# Patient Record
Sex: Female | Born: 1950 | Race: Black or African American | Hispanic: No | Smoking: Never smoker
Health system: Southern US, Community
[De-identification: ages and names within clinical notes are randomized; demographics above are authoritative.]

## PROBLEM LIST (undated history)

## (undated) ENCOUNTER — Emergency Department (HOSPITAL_COMMUNITY): Payer: Medicare HMO

## (undated) DIAGNOSIS — K219 Gastro-esophageal reflux disease without esophagitis: Secondary | ICD-10-CM

## (undated) DIAGNOSIS — C801 Malignant (primary) neoplasm, unspecified: Secondary | ICD-10-CM

## (undated) DIAGNOSIS — I10 Essential (primary) hypertension: Secondary | ICD-10-CM

## (undated) DIAGNOSIS — R600 Localized edema: Secondary | ICD-10-CM

## (undated) DIAGNOSIS — G500 Trigeminal neuralgia: Secondary | ICD-10-CM

## (undated) HISTORY — PX: FACIAL NERVE SURGERY: SHX630

## (undated) HISTORY — PX: CHOLECYSTECTOMY: SHX55

## (undated) HISTORY — PX: BREAST LUMPECTOMY: SHX2

## (undated) HISTORY — PX: ABDOMINAL HYSTERECTOMY: SHX81

---

## 1998-03-12 ENCOUNTER — Emergency Department (HOSPITAL_COMMUNITY): Admission: EM | Admit: 1998-03-12 | Discharge: 1998-03-12 | Payer: Self-pay | Admitting: Emergency Medicine

## 1998-04-20 ENCOUNTER — Observation Stay (HOSPITAL_COMMUNITY): Admission: RE | Admit: 1998-04-20 | Discharge: 1998-04-21 | Payer: Self-pay | Admitting: Specialist

## 1998-06-20 ENCOUNTER — Other Ambulatory Visit: Admission: RE | Admit: 1998-06-20 | Discharge: 1998-06-20 | Payer: Self-pay | Admitting: *Deleted

## 1998-10-25 ENCOUNTER — Ambulatory Visit (HOSPITAL_COMMUNITY): Admission: RE | Admit: 1998-10-25 | Discharge: 1998-10-25 | Payer: Self-pay | Admitting: General Surgery

## 1999-04-05 ENCOUNTER — Ambulatory Visit (HOSPITAL_COMMUNITY): Admission: RE | Admit: 1999-04-05 | Discharge: 1999-04-05 | Payer: Self-pay | Admitting: *Deleted

## 1999-10-17 ENCOUNTER — Ambulatory Visit (HOSPITAL_COMMUNITY): Admission: RE | Admit: 1999-10-17 | Discharge: 1999-10-17 | Payer: Self-pay | Admitting: General Surgery

## 1999-10-17 ENCOUNTER — Encounter (HOSPITAL_BASED_OUTPATIENT_CLINIC_OR_DEPARTMENT_OTHER): Payer: Self-pay | Admitting: General Surgery

## 2000-02-01 ENCOUNTER — Emergency Department (HOSPITAL_COMMUNITY): Admission: EM | Admit: 2000-02-01 | Discharge: 2000-02-02 | Payer: Self-pay | Admitting: Emergency Medicine

## 2000-10-12 ENCOUNTER — Ambulatory Visit (HOSPITAL_COMMUNITY): Admission: RE | Admit: 2000-10-12 | Discharge: 2000-10-12 | Payer: Self-pay | Admitting: *Deleted

## 2000-10-12 ENCOUNTER — Encounter: Payer: Self-pay | Admitting: *Deleted

## 2000-10-19 ENCOUNTER — Encounter (HOSPITAL_BASED_OUTPATIENT_CLINIC_OR_DEPARTMENT_OTHER): Payer: Self-pay | Admitting: General Surgery

## 2000-10-19 ENCOUNTER — Ambulatory Visit (HOSPITAL_COMMUNITY): Admission: RE | Admit: 2000-10-19 | Discharge: 2000-10-19 | Payer: Self-pay | Admitting: General Surgery

## 2001-10-29 ENCOUNTER — Ambulatory Visit (HOSPITAL_COMMUNITY): Admission: RE | Admit: 2001-10-29 | Discharge: 2001-10-29 | Payer: Self-pay | Admitting: General Surgery

## 2001-10-29 ENCOUNTER — Encounter (HOSPITAL_BASED_OUTPATIENT_CLINIC_OR_DEPARTMENT_OTHER): Payer: Self-pay | Admitting: General Surgery

## 2001-11-25 ENCOUNTER — Inpatient Hospital Stay (HOSPITAL_COMMUNITY): Admission: EM | Admit: 2001-11-25 | Discharge: 2001-11-27 | Payer: Self-pay | Admitting: Emergency Medicine

## 2001-11-25 ENCOUNTER — Encounter: Payer: Self-pay | Admitting: Endocrinology

## 2001-11-26 ENCOUNTER — Encounter: Payer: Self-pay | Admitting: Cardiology

## 2002-01-07 ENCOUNTER — Emergency Department (HOSPITAL_COMMUNITY): Admission: EM | Admit: 2002-01-07 | Discharge: 2002-01-07 | Payer: Self-pay | Admitting: Emergency Medicine

## 2002-01-07 ENCOUNTER — Encounter: Payer: Self-pay | Admitting: Emergency Medicine

## 2002-05-24 ENCOUNTER — Emergency Department (HOSPITAL_COMMUNITY): Admission: EM | Admit: 2002-05-24 | Discharge: 2002-05-24 | Payer: Self-pay | Admitting: Emergency Medicine

## 2002-05-24 ENCOUNTER — Encounter: Payer: Self-pay | Admitting: Emergency Medicine

## 2002-07-28 ENCOUNTER — Encounter: Payer: Self-pay | Admitting: *Deleted

## 2002-07-28 ENCOUNTER — Ambulatory Visit (HOSPITAL_COMMUNITY): Admission: RE | Admit: 2002-07-28 | Discharge: 2002-07-28 | Payer: Self-pay | Admitting: *Deleted

## 2003-03-22 ENCOUNTER — Encounter: Admission: RE | Admit: 2003-03-22 | Discharge: 2003-03-22 | Payer: Self-pay | Admitting: General Surgery

## 2003-03-22 ENCOUNTER — Encounter (HOSPITAL_BASED_OUTPATIENT_CLINIC_OR_DEPARTMENT_OTHER): Payer: Self-pay | Admitting: General Surgery

## 2003-03-23 ENCOUNTER — Ambulatory Visit (HOSPITAL_COMMUNITY): Admission: RE | Admit: 2003-03-23 | Discharge: 2003-03-23 | Payer: Self-pay | Admitting: Internal Medicine

## 2003-03-23 ENCOUNTER — Encounter (HOSPITAL_BASED_OUTPATIENT_CLINIC_OR_DEPARTMENT_OTHER): Payer: Self-pay | Admitting: General Surgery

## 2005-08-08 ENCOUNTER — Ambulatory Visit: Payer: Self-pay | Admitting: Oncology

## 2006-08-06 ENCOUNTER — Ambulatory Visit: Payer: Self-pay | Admitting: Oncology

## 2006-08-11 ENCOUNTER — Ambulatory Visit (HOSPITAL_COMMUNITY): Admission: RE | Admit: 2006-08-11 | Discharge: 2006-08-11 | Payer: Self-pay | Admitting: Oncology

## 2006-08-11 LAB — CBC WITH DIFFERENTIAL/PLATELET
BASO%: 0.4 % (ref 0.0–2.0)
Eosinophils Absolute: 0.2 10*3/uL (ref 0.0–0.5)
LYMPH%: 21.3 % (ref 14.0–48.0)
MCHC: 33 g/dL (ref 32.0–36.0)
MONO#: 0.4 10*3/uL (ref 0.1–0.9)
NEUT#: 4.3 10*3/uL (ref 1.5–6.5)
Platelets: 301 10*3/uL (ref 145–400)
RBC: 4.45 10*6/uL (ref 3.70–5.32)
RDW: 14.1 % (ref 11.3–14.5)
WBC: 6.3 10*3/uL (ref 3.9–10.0)
lymph#: 1.3 10*3/uL (ref 0.9–3.3)

## 2006-08-11 LAB — COMPREHENSIVE METABOLIC PANEL
ALT: 11 U/L (ref 0–35)
Albumin: 4.1 g/dL (ref 3.5–5.2)
CO2: 25 mEq/L (ref 19–32)
Chloride: 106 mEq/L (ref 96–112)
Glucose, Bld: 93 mg/dL (ref 70–99)
Potassium: 3.9 mEq/L (ref 3.5–5.3)
Sodium: 143 mEq/L (ref 135–145)
Total Protein: 6.8 g/dL (ref 6.0–8.3)

## 2006-08-11 LAB — LACTATE DEHYDROGENASE: LDH: 155 U/L (ref 94–250)

## 2007-08-13 ENCOUNTER — Ambulatory Visit: Payer: Self-pay | Admitting: Oncology

## 2007-08-31 LAB — COMPREHENSIVE METABOLIC PANEL
ALT: 16 U/L (ref 0–35)
AST: 17 U/L (ref 0–37)
Albumin: 4 g/dL (ref 3.5–5.2)
Alkaline Phosphatase: 43 U/L (ref 39–117)
BUN: 6 mg/dL (ref 6–23)
CO2: 26 mEq/L (ref 19–32)
Calcium: 9.1 mg/dL (ref 8.4–10.5)
Chloride: 107 mEq/L (ref 96–112)
Creatinine, Ser: 0.72 mg/dL (ref 0.40–1.20)
Glucose, Bld: 81 mg/dL (ref 70–99)
Potassium: 3.1 mEq/L — ABNORMAL LOW (ref 3.5–5.3)
Sodium: 143 mEq/L (ref 135–145)
Total Bilirubin: 1.1 mg/dL (ref 0.3–1.2)
Total Protein: 6.6 g/dL (ref 6.0–8.3)

## 2007-08-31 LAB — CBC WITH DIFFERENTIAL/PLATELET
BASO%: 0.3 % (ref 0.0–2.0)
Basophils Absolute: 0 10*3/uL (ref 0.0–0.1)
EOS%: 4.2 % (ref 0.0–7.0)
Eosinophils Absolute: 0.2 10*3/uL (ref 0.0–0.5)
HCT: 35.8 % (ref 34.8–46.6)
HGB: 12 g/dL (ref 11.6–15.9)
LYMPH%: 23.2 % (ref 14.0–48.0)
MCH: 28 pg (ref 26.0–34.0)
MCHC: 33.6 g/dL (ref 32.0–36.0)
MCV: 83.4 fL (ref 81.0–101.0)
MONO#: 0.5 10*3/uL (ref 0.1–0.9)
MONO%: 9.6 % (ref 0.0–13.0)
NEUT#: 3.2 10*3/uL (ref 1.5–6.5)
NEUT%: 62.7 % (ref 39.6–76.8)
Platelets: 247 10*3/uL (ref 145–400)
RBC: 4.29 10*6/uL (ref 3.70–5.32)
RDW: 14.9 % — ABNORMAL HIGH (ref 11.3–14.5)
WBC: 5.1 10*3/uL (ref 3.9–10.0)
lymph#: 1.2 10*3/uL (ref 0.9–3.3)

## 2007-08-31 LAB — LACTATE DEHYDROGENASE: LDH: 147 U/L (ref 94–250)

## 2008-08-29 ENCOUNTER — Ambulatory Visit: Payer: Self-pay | Admitting: Oncology

## 2010-12-13 NOTE — H&P (Signed)
[place]  Patient:    Rebekah Steele, Rebekah Steele Visit Number: [id number] MRN: [id number]          Service Type: MED Location: [id number] Attending Physician:  Rollene Rotunda Dictated by:   Rollene Rotunda, M.D. [facility] Admit Date:  11/25/2001   CC:         Justine Null, M.D. [facility]   History and Physical  PRIMARY CARE PHYSICIAN:  Dr. Justine Null.  CARDIOLOGIST:  The patient is new to [place].  HISTORY OF PRESENT ILLNESS:  The patient is a 60 year old African American female with no prior cardiac history.  She does report that she had an exercise treadmill test four to five years ago that was apparently normal. She says that she has been having palpitations where she will feel her heart "pounding."  She is describing isolated premature beats.  She has not been describing any sustained arrhythmias, presyncope or syncope.  She was treated with Toprol on April 25th.  She says that following that she developed some episodes of chest heaviness; she also had some mild dizziness with nausea and some orthostatic symptoms, despite this being a low dose of beta blocker. Last night at 9 p.m. or so, she developed some substernal chest discomfort that radiated through to her back.  This lasted all night long.  She said that at it worst, it was 10/10.  She could not sleep with this.  She became nauseated.  She did have some sweats.  She had some radiation into her right shoulder.  She was referred to the emergency room this morning because of this discomfort.  In the emergency room, she had no significant response to nitroglycerin.  She did get relief after 1 mg of Ativan.  She is currently pain-free.  She says these symptoms are not similar to previous reflux.  The patient does report some shortness of breath with activities such as climbing stairs and a decreased exercise tolerance over the past year.  She otherwise has had no exertional chest discomfort in  the past.  PAST MEDICAL HISTORY:  Gastroesophageal reflux disease; hypertension since 1985; diabetes mellitus, recently diagnosed; anxiety/depression; eczema; breast cancer, status post lumpectomy; hyperlipidemia.  PAST SURGICAL HISTORY:  Lumpectomy and lymph node dissection in 1992; cholecystectomy/appendectomy in 1983; hysterectomy in 1993.  ALLERGIES:  None.  MEDICATIONS:  1. K-Dur 20 mEq b.i.d.  2. Glucophage 1 g b.i.d.  3. Nexium 40 mg q.d.  4. Atarax 25 mg q.d.  5. Wellbutrin 150 mg b.i.d.  6. Lasix 20 mg q.d.  7. Actos 30 mg q.d.  8. Mevacor 20 mg q.d.  9. Toprol-XL 25 mg q.d. 10. Avandia 4 mg q.d. 11. Maxzide 25 mg q.d.  SOCIAL HISTORY:  The patient lives in [place] with her husband.  She is applying for disability.  She does not smoke or drink alcohol.  FAMILY HISTORY:  Family history is noncontributory for early coronary artery disease.  There is a history of diabetes in the family.  REVIEW OF SYSTEMS:  Positive for headaches, chronic blurred vision. Otherwise, as stated in HPI.  Negative for all other systems.  PHYSICAL EXAMINATION:  GENERAL:  The patient is in no distress.  VITAL SIGNS:  Blood pressure 135/62, heart rate 60 and regular, afebrile.  HEENT:  Eyes unremarkable; pupils equal, round and reactive to light; fundi not visualized.  Oral mucosa unremarkable.  NECK:  No jugular venous distention, wave form within normal limits, carotid upstroke brisk and symmetric.  No bruits, no thyromegaly.  LYMPHATICS:  No cervical, axillary or inguinal adenopathy.  LUNGS:  Clear to auscultation bilaterally.  BACK:  No costovertebral angle tenderness.  CHEST:  Unremarkable.  HEART:  PMI not displaced or sustained.  S1 and S2 within normal limits.  No S3, no S4, no murmurs.  ABDOMEN:  Obese.  Positive bowel sounds, normal in frequency and pitch.  No bruits, rebound, guarding or pulsatile masses.  No hepatomegaly, splenomegaly.  SKIN:  No rash, no  nodules.  EXTREMITIES:  Pulses 2+ throughout, no edema.  NEUROLOGIC:  Oriented to person, place and time.  Cranial nerves II-XII grossly intact.  Motor grossly intact throughout.  LABORATORY AND ACCESSORY DATA:  EKG:  Sinus rhythm, rate 59, axis within normal limits, intervals within normal limits, T wave inversions in V3 through V5, slight and unchanged from a January 2003 EKG, no acute ST segment elevation.  Chest x-ray:  No acute disease.  Labs:  Hemoglobin 13.3, WBC 6.5, platelets 238,000; sodium 143, potassium 4, BUN 12, creatinine 0.8; CK 148, MB 0.7, troponin less than 0.01.  ASSESSMENT AND PLAN:  1. Chest pain:  The patients chest pain is predominantly atypical in her     description.  She had pain all night long and despite this, had no     elevation in her enzymes.  The discomfort did not go away with     nitroglycerin.  She has had no objective evidence of ischemia (normal     enzymes and no acute ST segment changes), however, she does have     cardiovascular risk factors.  Given this, I think the most prudent test is     a stress perfusion study.  I have discussed this at length with the     patient and she understands the risks and benefits and agrees to proceed.     We will continue to rule out an infarct with serial enzymes.  2. Metabolic syndrome:  The patient has classic features of metabolic     syndrome and is being followed and treated aggressively by Dr. Everardo All.     We will continue the medications as previously listed for her     hyperlipidemia and hyperglycemia. Dictated by:   Rollene Rotunda, M.D. [facility] Attending Physician:  Rollene Rotunda DD:  11/25/01 TD:  11/26/01 Job: [id number]

## 2010-12-13 NOTE — Discharge Summary (Signed)
[place]  Patient:    Rebekah Steele, Rebekah Steele Visit Number: [id number] MRN: [id number]          Service Type: MED Location: [id number] Attending Physician:  Rollene Rotunda Dictated by:   Brita Romp, P.A. Admit Date:  11/25/2001 Discharge Date: 11/27/2001   CC:         Justine Null, M.D. [facility]   Discharge Summary  DISCHARGE DIAGNOSES:  1. Chest pain.  2. Palpitations.  3. Gastroesophageal reflux disease.  4. Hypertension.  5. Diabetes mellitus.  6. Anxiety/depression.  7. Eczema.  8. History of breast cancer, status post lumpectomy.  9. Hyperlipidemia.  HOSPITAL COURSE:  Rebekah Steele is a 60 year old female with no prior coronary artery disease history. On the night prior to admission, she developed some substernal chest pain which radiated through to her back and lasted all night long. She presented to the emergency room the following day for treatment. She had no response to nitroglycerin; however, did get relief after approximately 1 mg of Ativan. She was then admitted by Dr. Rollene Rotunda. Dr. Antoine Poche felt the patients pain was rather atypical; however, given her cardiovascular risk factors, he planned to admit her and rule out for myocardial infarction. If enzymes were negative then the patient would undergo a nuclear stress test. He also felt that the patient had classic features of metabolic syndrome which is being treated and followed aggressively by Dr. Everardo All.  The next day, the patient underwent adenosine cardiolite stress test. Nuclear imaging revealed an ejection fraction of 57% with no ischemia or infarction.  The following day, the patient was doing well and was felt to be stable for discharge.  DISCHARGE MEDICATIONS:  1. Potassium 20 mEq b.i.d.  2. Glucophage 1000 mg b.i.d.  3. Nexium 40 mg q.d.  4. Atarax 25 mg q.d.  5. Wellbutrin 150 mg b.i.d.  6. Lasix 20 mg q.d.  7. Actose 30 mg q.d.  8. Mevacor 20 mg  q.h.s.  9. Toprol XL 25 mg q.d. 10. Avandia 4 mg q.d. 11. Maxzide 25 mg q.d. 12. Enteric coated aspirin 325 mg q.d.  LABORATORY DATA:  White count 6.5, hemoglobin 13.3, hematocrit 40.5, platelets 238. Sodium 143, potassium 4.0, chloride 107, CO2 28, BUN 12, creatinine 0.8, glucose 80. Total protein 7.7, albumin 4.0. AST 27, ALT 17, alkaline phosphatase 38, total bilirubin 1.4. Glycosylated hemoglobin A1C was pending at the time discharge.  Electrocardiogram shows sinus bradycardia 59. PRF of 146, QRS 79, QTC 413, ______ 51.  DISCHARGE INSTRUCTIONS:  1. The patient is to gradually increase level of activity.  2. She is to follow a low fat diabetic diet.  3. She is to contact Dr. Everardo All for a follow-up appointment. Dictated by:   Brita Romp, P.A. Attending Physician:  Rollene Rotunda DD:  11/27/01 TD:  11/30/01 Job: [id number]

## 2011-11-10 ENCOUNTER — Other Ambulatory Visit: Payer: Self-pay | Admitting: Cardiovascular Disease

## 2011-11-10 ENCOUNTER — Ambulatory Visit
Admission: RE | Admit: 2011-11-10 | Discharge: 2011-11-10 | Disposition: A | Discharge status: Home / Self Care | Payer: Medicaid Other | Source: Ambulatory Visit | Attending: Cardiovascular Disease | Admitting: Cardiovascular Disease

## 2011-11-10 DIAGNOSIS — R200 Anesthesia of skin: Secondary | ICD-10-CM

## 2012-02-17 ENCOUNTER — Ambulatory Visit
Admission: RE | Admit: 2012-02-17 | Discharge: 2012-02-17 | Disposition: A | Discharge status: Home / Self Care | Payer: Medicaid Other | Source: Ambulatory Visit | Attending: Cardiovascular Disease | Admitting: Cardiovascular Disease

## 2012-02-17 ENCOUNTER — Other Ambulatory Visit: Payer: Self-pay | Admitting: Cardiovascular Disease

## 2012-02-17 DIAGNOSIS — M7500 Adhesive capsulitis of unspecified shoulder: Secondary | ICD-10-CM

## 2012-04-22 ENCOUNTER — Other Ambulatory Visit: Payer: Self-pay | Admitting: Sports Medicine

## 2012-04-22 DIAGNOSIS — M25511 Pain in right shoulder: Secondary | ICD-10-CM

## 2012-04-25 ENCOUNTER — Other Ambulatory Visit: Payer: Medicaid Other

## 2012-04-26 ENCOUNTER — Ambulatory Visit
Admission: RE | Admit: 2012-04-26 | Discharge: 2012-04-26 | Disposition: A | Discharge status: Home / Self Care | Payer: Medicaid Other | Source: Ambulatory Visit | Attending: Sports Medicine | Admitting: Sports Medicine

## 2012-04-26 DIAGNOSIS — M25511 Pain in right shoulder: Secondary | ICD-10-CM

## 2012-08-30 ENCOUNTER — Ambulatory Visit (HOSPITAL_BASED_OUTPATIENT_CLINIC_OR_DEPARTMENT_OTHER): Admission: RE | Admit: 2012-08-30 | Payer: Medicare Other | Source: Ambulatory Visit | Admitting: Specialist

## 2012-08-30 ENCOUNTER — Encounter (HOSPITAL_BASED_OUTPATIENT_CLINIC_OR_DEPARTMENT_OTHER): Admission: RE | Payer: Self-pay | Source: Ambulatory Visit

## 2012-08-30 SURGERY — SEPTOPLASTY, NOSE, WITH NASAL TURBINATE REDUCTION
Anesthesia: General | Laterality: Bilateral

## 2012-09-20 ENCOUNTER — Other Ambulatory Visit: Payer: Self-pay | Admitting: Cardiovascular Disease

## 2012-09-20 ENCOUNTER — Ambulatory Visit
Admission: RE | Admit: 2012-09-20 | Discharge: 2012-09-20 | Disposition: A | Discharge status: Home / Self Care | Payer: Medicare Other | Source: Ambulatory Visit | Attending: Cardiovascular Disease | Admitting: Cardiovascular Disease

## 2012-09-20 DIAGNOSIS — A150 Tuberculosis of lung: Secondary | ICD-10-CM

## 2013-05-16 ENCOUNTER — Other Ambulatory Visit: Payer: Self-pay | Admitting: Cardiovascular Disease

## 2013-05-16 ENCOUNTER — Ambulatory Visit
Admission: RE | Admit: 2013-05-16 | Discharge: 2013-05-16 | Disposition: A | Discharge status: Home / Self Care | Payer: Medicare Other | Source: Ambulatory Visit | Attending: Cardiovascular Disease | Admitting: Cardiovascular Disease

## 2013-05-16 DIAGNOSIS — M549 Dorsalgia, unspecified: Secondary | ICD-10-CM

## 2015-11-26 DIAGNOSIS — R600 Localized edema: Secondary | ICD-10-CM

## 2015-11-26 HISTORY — DX: Localized edema: R60.0

## 2015-12-26 ENCOUNTER — Observation Stay (HOSPITAL_COMMUNITY): Payer: Medicare Other

## 2015-12-26 ENCOUNTER — Inpatient Hospital Stay (HOSPITAL_COMMUNITY)
Admission: AD | Admit: 2015-12-26 | Discharge: 2015-12-31 | DRG: 948 | Disposition: A | Discharge status: Home / Self Care | Payer: Medicare Other | Source: Ambulatory Visit | Attending: Cardiovascular Disease | Admitting: Cardiovascular Disease

## 2015-12-26 ENCOUNTER — Encounter (HOSPITAL_COMMUNITY): Payer: Self-pay | Admitting: General Practice

## 2015-12-26 DIAGNOSIS — R600 Localized edema: Principal | ICD-10-CM | POA: Diagnosis present

## 2015-12-26 DIAGNOSIS — Z91040 Latex allergy status: Secondary | ICD-10-CM

## 2015-12-26 DIAGNOSIS — Z79899 Other long term (current) drug therapy: Secondary | ICD-10-CM

## 2015-12-26 DIAGNOSIS — R079 Chest pain, unspecified: Secondary | ICD-10-CM

## 2015-12-26 DIAGNOSIS — D72819 Decreased white blood cell count, unspecified: Secondary | ICD-10-CM | POA: Diagnosis present

## 2015-12-26 DIAGNOSIS — Z8507 Personal history of malignant neoplasm of pancreas: Secondary | ICD-10-CM | POA: Diagnosis present

## 2015-12-26 DIAGNOSIS — Z888 Allergy status to other drugs, medicaments and biological substances status: Secondary | ICD-10-CM

## 2015-12-26 DIAGNOSIS — A419 Sepsis, unspecified organism: Secondary | ICD-10-CM

## 2015-12-26 DIAGNOSIS — Z9884 Bariatric surgery status: Secondary | ICD-10-CM

## 2015-12-26 DIAGNOSIS — Z91048 Other nonmedicinal substance allergy status: Secondary | ICD-10-CM

## 2015-12-26 DIAGNOSIS — I255 Ischemic cardiomyopathy: Secondary | ICD-10-CM | POA: Diagnosis present

## 2015-12-26 DIAGNOSIS — E440 Moderate protein-calorie malnutrition: Secondary | ICD-10-CM | POA: Insufficient documentation

## 2015-12-26 DIAGNOSIS — T8579XA Infection and inflammatory reaction due to other internal prosthetic devices, implants and grafts, initial encounter: Secondary | ICD-10-CM

## 2015-12-26 DIAGNOSIS — I10 Essential (primary) hypertension: Secondary | ICD-10-CM | POA: Diagnosis present

## 2015-12-26 DIAGNOSIS — R634 Abnormal weight loss: Secondary | ICD-10-CM | POA: Diagnosis present

## 2015-12-26 DIAGNOSIS — Z90710 Acquired absence of both cervix and uterus: Secondary | ICD-10-CM

## 2015-12-26 DIAGNOSIS — E876 Hypokalemia: Secondary | ICD-10-CM | POA: Diagnosis present

## 2015-12-26 DIAGNOSIS — Z7982 Long term (current) use of aspirin: Secondary | ICD-10-CM

## 2015-12-26 DIAGNOSIS — I509 Heart failure, unspecified: Secondary | ICD-10-CM

## 2015-12-26 HISTORY — DX: Malignant (primary) neoplasm, unspecified: C80.1

## 2015-12-26 HISTORY — DX: Essential (primary) hypertension: I10

## 2015-12-26 HISTORY — DX: Trigeminal neuralgia: G50.0

## 2015-12-26 HISTORY — DX: Gastro-esophageal reflux disease without esophagitis: K21.9

## 2015-12-26 HISTORY — DX: Localized edema: R60.0

## 2015-12-26 LAB — COMPREHENSIVE METABOLIC PANEL
ALBUMIN: 2.7 g/dL — AB (ref 3.5–5.0)
ALK PHOS: 40 U/L (ref 38–126)
ALT: 26 U/L (ref 14–54)
ANION GAP: 5 (ref 5–15)
AST: 28 U/L (ref 15–41)
BILIRUBIN TOTAL: 0.6 mg/dL (ref 0.3–1.2)
BUN: 12 mg/dL (ref 6–20)
CALCIUM: 8.7 mg/dL — AB (ref 8.9–10.3)
CO2: 30 mmol/L (ref 22–32)
Chloride: 103 mmol/L (ref 101–111)
Creatinine, Ser: 0.77 mg/dL (ref 0.44–1.00)
GFR calc non Af Amer: >60 mL/min (ref >60)
GLUCOSE: 85 mg/dL (ref 65–99)
Potassium: 3.1 mmol/L — ABNORMAL LOW (ref 3.5–5.1)
Sodium: 138 mmol/L (ref 135–145)
TOTAL PROTEIN: 5.1 g/dL — AB (ref 6.5–8.1)

## 2015-12-26 LAB — CBC WITH DIFFERENTIAL/PLATELET
BASOS ABS: 0 10*3/uL (ref 0.0–0.1)
BASOS PCT: 1 %
Eosinophils Absolute: 0.3 10*3/uL (ref 0.0–0.7)
Eosinophils Relative: 10 %
HEMATOCRIT: 37.1 % (ref 36.0–46.0)
HEMOGLOBIN: 12 g/dL (ref 12.0–15.0)
LYMPHS PCT: 14 %
Lymphs Abs: 0.4 10*3/uL — ABNORMAL LOW (ref 0.7–4.0)
MCH: 29.4 pg (ref 26.0–34.0)
MCHC: 32.3 g/dL (ref 30.0–36.0)
MCV: 90.9 fL (ref 78.0–100.0)
Monocytes Absolute: 0.4 10*3/uL (ref 0.1–1.0)
Monocytes Relative: 11 %
NEUTROS ABS: 2 10*3/uL (ref 1.7–7.7)
NEUTROS PCT: 64 %
Platelets: 151 10*3/uL (ref 150–400)
RBC: 4.08 MIL/uL (ref 3.87–5.11)
RDW: 14.8 % (ref 11.5–15.5)
WBC: 3.2 10*3/uL — AB (ref 4.0–10.5)

## 2015-12-26 LAB — TSH: TSH: 0.993 u[IU]/mL (ref 0.350–4.500)

## 2015-12-26 LAB — ECHOCARDIOGRAM COMPLETE
Height: 67 in
Weight: 2158.74 oz

## 2015-12-26 LAB — TROPONIN I: Troponin I: 0.03 ng/mL (ref <0.031)

## 2015-12-26 LAB — BRAIN NATRIURETIC PEPTIDE: B Natriuretic Peptide: 60.2 pg/mL (ref 0.0–100.0)

## 2015-12-26 MED ORDER — ENSURE ENLIVE PO LIQD
237.0000 mL | Freq: Two times a day (BID) | ORAL | Status: DC
  Administered 2015-12-26 – 2015-12-27 (×2): 237 mL via ORAL

## 2015-12-26 MED ORDER — ONDANSETRON HCL 4 MG/2ML IJ SOLN: 4.0000 mg | Freq: Four times a day (QID) | INTRAMUSCULAR | Status: DC | PRN

## 2015-12-26 MED ORDER — FUROSEMIDE 10 MG/ML IJ SOLN
40.0000 mg | Freq: Once | INTRAMUSCULAR | Status: DC
  Filled 2015-12-26 (×2): qty 4

## 2015-12-26 MED ORDER — SODIUM CHLORIDE 0.9 % IV SOLN: 250.0000 mL | INTRAVENOUS | Status: DC | PRN

## 2015-12-26 MED ORDER — SODIUM CHLORIDE 0.9% FLUSH
10.0000 mL | INTRAVENOUS | Status: DC | PRN
  Administered 2015-12-26 – 2015-12-31 (×3): 10 mL
  Filled 2015-12-26 (×3): qty 40

## 2015-12-26 MED ORDER — ACETAMINOPHEN 325 MG PO TABS
650.0000 mg | ORAL_TABLET | ORAL | Status: DC | PRN
  Administered 2015-12-26 – 2015-12-29 (×4): 650 mg via ORAL
  Filled 2015-12-26 (×4): qty 2

## 2015-12-26 MED ORDER — LIDOCAINE 5 % EX OINT
TOPICAL_OINTMENT | Freq: Three times a day (TID) | CUTANEOUS | Status: DC | PRN
  Filled 2015-12-26: qty 35.44

## 2015-12-26 MED ORDER — SODIUM CHLORIDE 0.9% FLUSH
3.0000 mL | Freq: Two times a day (BID) | INTRAVENOUS | Status: DC
Start: 2015-12-26 — End: 2015-12-31
  Administered 2015-12-26 – 2015-12-30 (×4): 3 mL via INTRAVENOUS

## 2015-12-26 MED ORDER — FUROSEMIDE 10 MG/ML IJ SOLN
40.0000 mg | Freq: Once | INTRAMUSCULAR | Status: DC
  Administered 2015-12-26: 40 mg via INTRAVENOUS

## 2015-12-26 MED ORDER — SODIUM CHLORIDE 0.9% FLUSH: 3.0000 mL | INTRAVENOUS | Status: DC | PRN

## 2015-12-26 MED ORDER — LIDOCAINE-PRILOCAINE 2.5-2.5 % EX CREA
TOPICAL_CREAM | Freq: Once | CUTANEOUS | Status: AC
  Administered 2015-12-26: 1 via TOPICAL
  Filled 2015-12-26: qty 5

## 2015-12-26 MED ORDER — HEPARIN SODIUM (PORCINE) 5000 UNIT/ML IJ SOLN
5000.0000 [IU] | Freq: Three times a day (TID) | INTRAMUSCULAR | Status: DC
  Administered 2015-12-26 – 2015-12-28 (×6): 5000 [IU] via SUBCUTANEOUS
  Filled 2015-12-26 (×6): qty 1

## 2015-12-26 NOTE — H&P (Signed)
Referring Physician:   [name] is an 65 y.o. female.                       Chief Complaint: Bilateral leg edema  HPI: 65 year old female with PMH of Hypertension, s/p hysterectomy, Breast cancer surgery, GB surgery-1986, Gastric bypass 2008 and Pancreatic surgery for cancer at [facility] 03/2015 has abnormal weight loss, shortness of breath and bilateral leg edema without chest pain.   Past medical history: No diabetes, + hypertension since 1985, No smoking, No alcohol, No MI, No obesity, No exercise and no FH of premature .coronary artery disease.  Past surgical history: Partial hysterectomy-1996, GB surgery-1986, Breast cancer surgery-1991. Gastric bypass-02/2007, Trigeminal neuralgia-2010. Failed right nasal septal surgery and Pancreatic cancer surgery -04/08/2015, followed by chemotherapy and radiation therapy..  Family history : Mom died of renal failure age 2.Dad died of brain aneurysm rupture at age 52. Two brothers, living and well. 6 Sisters, all living and some have DM, II and Hypertension. on file.  Social History:  has no tobacco, alcohol, and drug history. Separated x 4 years. She has 2 daughters-34 and 38 yrs old and one son 14 years old. She is a retired and disabled bus Corporate treasurer.  Allergies: Carvedilol-bradycardia, Latex, Adhesive tape and paper tape. Lisinopril-Lip swelling  Prescriptions prior to admission  Aspirin 81 mg. one daily. Amlodipine 2.5 mg. In PM. HCTZ 12.5 mg. In AM. Potassium 10 meq. one daily. Calcium 600 mg. one daily. Vitamin D3 2000 units daily. Singulair 10 mg. one daily as needed. Flonase one squirt each nostril as needed. Magnesium oxide 400 mg. one daily. Iron 65 mg. one daily. Omeprazole 40 mg. One daily.   No results found for this or any previous visit (from the past 48 hour(s)). No results found.  Review Of Systems Positive 20 pound weight loss in 2-3 years, Wears reading and driving glasses, No cataract surgery,  Positive hearing loss in right ear.with tinnitus, + rhinorrhea, No dentures.  Positive Asthma, no pneumonia, No COPD.  No chest pain, No palpitations, + leg edema. Positive nausea, no vomiting, no diarrhea or constipation, No GI bleed. No GU bleed, No kidney stone. No stroke, seizures or psychiatric admission. Positive Joint pains.  Blood pressure 127/78, pulse 61, temperature 98.1 F (36.7 C), temperature source Oral, resp. rate 20, height 5\' 7"  (1.702 m), weight 61.2 kg (134 lb 14.7 oz). HEENT: [place]/AT, Brown eyes, Wears glasses, Conj-pink, Sclera-white. Neck: No JVD, No bruit. No thyromegaly. Lungs: Clear, bil. Heart: S1 and S2 normal. II/VI systolic and diastolic murmur. Abdomen: Soft, non-tender. Ext- 2 + edema. No cyanosis or clubbing. CNS: Cranial nerves grossly intact. Bil. Equal grips. Right handed. Skin: Warm and dry.  Assessment/Plan Bil. Leg edema R/O CHF S/P pancreatic surgery Severe protein calorie malnutrition Hypertension  Place in observation/Lasix/Home medications/Echocardiogram.  Birdie Riddle, MD  12/26/2015, 2:53 PM

## 2015-12-26 NOTE — Progress Notes (Signed)
  Echocardiogram 2D Echocardiogram has been performed.  Rebekah Steele 12/26/2015, 2:55 PM

## 2015-12-27 ENCOUNTER — Observation Stay (HOSPITAL_COMMUNITY): Payer: Medicare Other

## 2015-12-27 DIAGNOSIS — E440 Moderate protein-calorie malnutrition: Secondary | ICD-10-CM | POA: Insufficient documentation

## 2015-12-27 LAB — BASIC METABOLIC PANEL
Anion gap: 6 (ref 5–15)
BUN: 15 mg/dL (ref 6–20)
CHLORIDE: 101 mmol/L (ref 101–111)
CO2: 32 mmol/L (ref 22–32)
CREATININE: 0.74 mg/dL (ref 0.44–1.00)
Calcium: 8.6 mg/dL — ABNORMAL LOW (ref 8.9–10.3)
GFR calc Af Amer: >60 mL/min (ref >60)
GFR calc non Af Amer: >60 mL/min (ref >60)
GLUCOSE: 77 mg/dL (ref 65–99)
Potassium: 2.8 mmol/L — ABNORMAL LOW (ref 3.5–5.1)
SODIUM: 139 mmol/L (ref 135–145)

## 2015-12-27 LAB — TROPONIN I
Troponin I: <0.03 ng/mL (ref <0.031)
Troponin I: <0.03 ng/mL (ref <0.031)

## 2015-12-27 MED ORDER — ALBUMIN HUMAN 25 % IV SOLN
12.5000 g | Freq: Once | INTRAVENOUS | Status: AC
  Administered 2015-12-28: 12.5 g via INTRAVENOUS
  Filled 2015-12-27 (×2): qty 50

## 2015-12-27 MED ORDER — AMLODIPINE BESYLATE 2.5 MG PO TABS: 2.5000 mg | ORAL_TABLET | Freq: Every day | ORAL | Status: DC

## 2015-12-27 MED ORDER — PANTOPRAZOLE SODIUM 40 MG PO TBEC
40.0000 mg | DELAYED_RELEASE_TABLET | Freq: Every day | ORAL | Status: DC
  Administered 2015-12-27 – 2015-12-30 (×4): 40 mg via ORAL
  Filled 2015-12-27 (×4): qty 1

## 2015-12-27 MED ORDER — DOCUSATE SODIUM 100 MG PO CAPS
100.0000 mg | ORAL_CAPSULE | Freq: Every day | ORAL | Status: DC
  Administered 2015-12-27 – 2015-12-30 (×4): 100 mg via ORAL
  Filled 2015-12-27 (×4): qty 1

## 2015-12-27 MED ORDER — FUROSEMIDE 40 MG PO TABS
40.0000 mg | ORAL_TABLET | Freq: Once | ORAL | Status: AC
  Administered 2015-12-27: 40 mg via ORAL
  Filled 2015-12-27: qty 1

## 2015-12-27 MED ORDER — DARIFENACIN HYDROBROMIDE ER 7.5 MG PO TB24
7.5000 mg | ORAL_TABLET | Freq: Every day | ORAL | Status: DC
  Administered 2015-12-27 – 2015-12-30 (×4): 7.5 mg via ORAL
  Filled 2015-12-27 (×5): qty 1

## 2015-12-27 MED ORDER — MAGNESIUM OXIDE 400 (241.3 MG) MG PO TABS
400.0000 mg | ORAL_TABLET | Freq: Every day | ORAL | Status: DC
  Administered 2015-12-27 – 2015-12-30 (×4): 400 mg via ORAL
  Filled 2015-12-27 (×4): qty 1

## 2015-12-27 MED ORDER — ADULT MULTIVITAMIN W/MINERALS CH
1.0000 | ORAL_TABLET | Freq: Every day | ORAL | Status: DC
  Administered 2015-12-27 – 2015-12-30 (×4): 1 via ORAL
  Filled 2015-12-27 (×4): qty 1

## 2015-12-27 MED ORDER — ASPIRIN EC 81 MG PO TBEC
81.0000 mg | DELAYED_RELEASE_TABLET | Freq: Every day | ORAL | Status: DC
  Administered 2015-12-27 – 2015-12-31 (×5): 81 mg via ORAL
  Filled 2015-12-27 (×5): qty 1

## 2015-12-27 MED ORDER — POTASSIUM CHLORIDE ER 10 MEQ PO TBCR
20.0000 meq | EXTENDED_RELEASE_TABLET | Freq: Four times a day (QID) | ORAL | Status: DC
  Administered 2015-12-27 (×2): 20 meq via ORAL
  Filled 2015-12-27 (×5): qty 2

## 2015-12-27 MED ORDER — CYCLOSPORINE 0.05 % OP EMUL
1.0000 [drp] | Freq: Every day | OPHTHALMIC | Status: DC
  Administered 2015-12-27 – 2015-12-30 (×4): 1 [drp] via OPHTHALMIC
  Filled 2015-12-27 (×5): qty 1

## 2015-12-27 MED ORDER — PANCRELIPASE (LIP-PROT-AMYL) 12000-38000 UNITS PO CPEP
12000.0000 [IU] | ORAL_CAPSULE | Freq: Three times a day (TID) | ORAL | Status: DC
  Administered 2015-12-27 – 2015-12-31 (×7): 12000 [IU] via ORAL
  Filled 2015-12-27 (×10): qty 1

## 2015-12-27 MED ORDER — POTASSIUM CHLORIDE ER 10 MEQ PO TBCR
10.0000 meq | EXTENDED_RELEASE_TABLET | Freq: Four times a day (QID) | ORAL | Status: DC
  Administered 2015-12-27 (×2): 10 meq via ORAL
  Filled 2015-12-27 (×6): qty 1

## 2015-12-27 MED ORDER — FERROUS SULFATE 325 (65 FE) MG PO TABS
325.0000 mg | ORAL_TABLET | Freq: Every day | ORAL | Status: DC
  Administered 2015-12-27 – 2015-12-30 (×3): 325 mg via ORAL
  Filled 2015-12-27 (×4): qty 1

## 2015-12-27 MED ORDER — HYDRALAZINE HCL 10 MG PO TABS
10.0000 mg | ORAL_TABLET | Freq: Three times a day (TID) | ORAL | Status: DC
  Administered 2015-12-27 – 2015-12-28 (×2): 10 mg via ORAL
  Filled 2015-12-27 (×2): qty 1

## 2015-12-27 MED ORDER — MONTELUKAST SODIUM 10 MG PO TABS
10.0000 mg | ORAL_TABLET | Freq: Every day | ORAL | Status: DC
  Administered 2015-12-27 – 2015-12-30 (×4): 10 mg via ORAL
  Filled 2015-12-27 (×4): qty 1

## 2015-12-27 NOTE — Progress Notes (Signed)
Initial Nutrition Assessment  DOCUMENTATION CODES:   Non-severe (moderate) malnutrition in context of chronic illness  INTERVENTION:  Provide [employer] Breakfast daily Provide snacks BID Provided and discussed "Suggestions for Increasing Calories and Protein" handout from the Academy of Nutrition and Dietetics Provide Multivitamin with minerals daily   NUTRITION DIAGNOSIS:   Inadequate oral intake related to poor appetite, nausea, other (see comment) (taste changes) as evidenced by per patient/family report, moderate depletions of muscle mass, moderate depletion of body fat.   GOAL:   Patient will meet greater than or equal to 90% of their needs   MONITOR:   PO intake, Labs, Weight trends, Skin, I & O's  REASON FOR ASSESSMENT:   Malnutrition Screening Tool    ASSESSMENT:   65 year old female with PMH of Hypertension, s/p hysterectomy, Breast cancer surgery, GB surgery-1986, Gastric bypass 2008 and Pancreatic surgery for cancer at [facility] 03/2015 has abnormal weight loss, shortness of breath and bilateral leg edema without chest pain.   Pt states that after her pancreatic surgery in September in 2016, she had issues with reflux and poor appetite, was started on Prilosec with some improvement, but over the past 3 months she has continued to have a poor appetite, taste changes and some nausea. She reports that for the past 3 months she has been eating one plate of food throughout the day. She reports losing from 148 lbs to 124 lbs during this time. She dislikes Ensure. RD emphasized the importance of adequate nutrition and energy intake and weight maintenance. Discussed ways to increase calories and protein daily. Pt recently found a breakfast protein drink at home that she will continue after discharge. She reports getting hungry in the middle of the night- RD will order snacks.  Labs: low potassium, low calcium  Diet Order:  Diet Heart Room service appropriate?: Yes; Fluid  consistency:: Thin; Fluid restriction:: 1200 mL Fluid Diet NPO time specified  Skin:  Reviewed, no issues  Last BM:  5/31  Height:   Ht Readings from Last 1 Encounters:  12/26/15 5\' 7"  (1.702 m)    Weight:   Wt Readings from Last 1 Encounters:  12/27/15 128 lb 1.6 oz (58.106 kg)    Ideal Body Weight:  61.36 kg  BMI:  Body mass index is 20.06 kg/(m^2).  Estimated Nutritional Needs:   Kcal:  1700-1900  Protein:  70-80 grams  Fluid:  >/=1.7 L/day  EDUCATION NEEDS:   Education needs addressed  Scarlette Ar RD, LDN Inpatient Clinical Dietitian Pager: [phone] After Hours Pager: [phone]

## 2015-12-27 NOTE — Progress Notes (Signed)
Ref: Birdie Riddle, MD   Subjective:  Good diuresis with lasix. Breathing better. Echocardiogram with preserved LV systolic function. Very low albumin level.  Objective:  Vital Signs in the last 24 hours: Temp:  [97.9 F (36.6 C)-98.5 F (36.9 C)] 98.5 F (36.9 C) (06/01 1701) Pulse Rate:  [53-98] 60 (06/01 1701) Cardiac Rhythm:  [-] Normal sinus rhythm (06/01 0743) Resp:  [16-18] 18 (06/01 1701) BP: (105-128)/(61-76) 128/74 mmHg (06/01 1701) SpO2:  [100 %] 100 % (06/01 1701) Weight:  [58.106 kg (128 lb 1.6 oz)] 58.106 kg (128 lb 1.6 oz) (06/01 0705)  Physical Exam: BP Readings from Last 1 Encounters:  12/27/15 128/74    Wt Readings from Last 1 Encounters:  12/27/15 58.106 kg (128 lb 1.6 oz)    Weight change:   HEENT: [place]/AT, Eyes-Brown, PERL, EOMI, Conjunctiva-Pink, Sclera-Non-icteric Neck: No JVD, No bruit, Trachea midline. Lungs:  Clear, Bilateral. Cardiac:  Regular rhythm, normal S1 and S2, no S3. II/VI systolic murmur. Abdomen:  Soft, non-tender. Extremities:  1 + edema present. No cyanosis. No clubbing. CNS: AxOx3, Cranial nerves grossly intact, moves all 4 extremities. Right handed. Skin: Warm and dry.   Intake/Output from previous day: 05/31 0701 - 06/01 0700 In: 730 [P.O.:720; I.V.:10] Out: 2700 [Urine:2700]    Lab Results: BMET    Component Value Date/Time   NA 139 12/27/2015 0230   NA 138 12/26/2015 2000   NA 143 08/31/2007 1136   K 2.8* 12/27/2015 0230   K 3.1* 12/26/2015 2000   K 3.1* 08/31/2007 1136   CL 101 12/27/2015 0230   CL 103 12/26/2015 2000   CL 107 08/31/2007 1136   CO2 32 12/27/2015 0230   CO2 30 12/26/2015 2000   CO2 26 08/31/2007 1136   GLUCOSE 77 12/27/2015 0230   GLUCOSE 85 12/26/2015 2000   GLUCOSE 81 08/31/2007 1136   BUN 15 12/27/2015 0230   BUN 12 12/26/2015 2000   BUN 6 08/31/2007 1136   CREATININE 0.74 12/27/2015 0230   CREATININE 0.77 12/26/2015 2000   CREATININE 0.72 08/31/2007 1136   CALCIUM 8.6* 12/27/2015 0230    CALCIUM 8.7* 12/26/2015 2000   CALCIUM 9.1 08/31/2007 1136   GFRNONAA >60 12/27/2015 0230   GFRNONAA >60 12/26/2015 2000   GFRAA >60 12/27/2015 0230   GFRAA >60 12/26/2015 2000   CBC    Component Value Date/Time   WBC 3.2* 12/26/2015 2000   WBC 5.1 08/31/2007 1136   RBC 4.08 12/26/2015 2000   RBC 4.29 08/31/2007 1136   HGB 12.0 12/26/2015 2000   HGB 12.0 08/31/2007 1136   HCT 37.1 12/26/2015 2000   HCT 35.8 08/31/2007 1136   PLT 151 12/26/2015 2000   PLT 247 08/31/2007 1136   MCV 90.9 12/26/2015 2000   MCV 83.4 08/31/2007 1136   MCH 29.4 12/26/2015 2000   MCH 28.0 08/31/2007 1136   MCHC 32.3 12/26/2015 2000   MCHC 33.6 08/31/2007 1136   RDW 14.8 12/26/2015 2000   RDW 14.9* 08/31/2007 1136   LYMPHSABS 0.4* 12/26/2015 2000   LYMPHSABS 1.2 08/31/2007 1136   MONOABS 0.4 12/26/2015 2000   MONOABS 0.5 08/31/2007 1136   EOSABS 0.3 12/26/2015 2000   EOSABS 0.2 08/31/2007 1136   BASOSABS 0.0 12/26/2015 2000   BASOSABS 0.0 08/31/2007 1136   HEPATIC Function Panel  Recent Labs  12/26/15 2000  PROT 5.1*   HEMOGLOBIN A1C No components found for: HGA1C,  MPG CARDIAC ENZYMES Lab Results  Component Value Date   TROPONINI <0.03 12/27/2015  TROPONINI <0.03 12/27/2015   TROPONINI 0.03 12/26/2015   BNP No results for input(s): PROBNP in the last 8760 hours. TSH  Recent Labs  12/26/15 2000  TSH 0.993   CHOLESTEROL No results for input(s): CHOL in the last 8760 hours.  Scheduled Meds: . amLODipine  2.5 mg Oral Daily  . aspirin EC  81 mg Oral Daily  . cycloSPORINE  1 drop Both Eyes Daily  . darifenacin  7.5 mg Oral Daily  . docusate sodium  100 mg Oral Daily  . heparin  5,000 Units Subcutaneous Q8H  . Iron  325 mg Oral Daily  . [START ON 12/28/2015] lipase/protease/amylase  12,000 Units Oral TID WC  . magnesium oxide  400 mg Oral Daily  . montelukast  10 mg Oral QHS  . multivitamin with minerals  1 tablet Oral Daily  . pantoprazole  40 mg Oral Daily  .  potassium chloride  20 mEq Oral QID  . sodium chloride flush  3 mL Intravenous Q12H   Continuous Infusions:  PRN Meds:.sodium chloride, acetaminophen, lidocaine, ondansetron (ZOFRAN) IV, sodium chloride flush, sodium chloride flush  Assessment/Plan: Bil. Leg edema R/O CHF S/P pancreatic surgery Severe protein calorie malnutrition Hypertension Hypoalbuminemia   Change IV lasix to PO. IV albumin. Home medications but hold amlodipine. Support stoking.     Dixie Dials  MD  12/27/2015, 5:56 PM

## 2015-12-28 ENCOUNTER — Observation Stay (HOSPITAL_COMMUNITY): Payer: Medicare Other

## 2015-12-28 LAB — BASIC METABOLIC PANEL
Anion gap: 6 (ref 5–15)
BUN: 20 mg/dL (ref 6–20)
CALCIUM: 8.8 mg/dL — AB (ref 8.9–10.3)
CO2: 29 mmol/L (ref 22–32)
CREATININE: 0.83 mg/dL (ref 0.44–1.00)
Chloride: 106 mmol/L (ref 101–111)
GFR calc Af Amer: >60 mL/min (ref >60)
GFR calc non Af Amer: >60 mL/min (ref >60)
GLUCOSE: 76 mg/dL (ref 65–99)
Potassium: 3.9 mmol/L (ref 3.5–5.1)
Sodium: 141 mmol/L (ref 135–145)

## 2015-12-28 MED ORDER — TECHNETIUM TC 99M TETROFOSMIN IV KIT
30.0000 | PACK | Freq: Once | INTRAVENOUS | Status: AC | PRN
  Administered 2015-12-28: 30 via INTRAVENOUS

## 2015-12-28 MED ORDER — HEPARIN (PORCINE) IN NACL 100-0.45 UNIT/ML-% IJ SOLN
750.0000 [IU]/h | INTRAMUSCULAR | Status: DC
  Administered 2015-12-28: 750 [IU]/h via INTRAVENOUS
  Filled 2015-12-28: qty 250

## 2015-12-28 MED ORDER — REGADENOSON 0.4 MG/5ML IV SOLN
INTRAVENOUS | Status: AC
  Administered 2015-12-28: 16:00:00
  Filled 2015-12-28: qty 5

## 2015-12-28 MED ORDER — REGADENOSON 0.4 MG/5ML IV SOLN
0.4000 mg | Freq: Once | INTRAVENOUS | Status: AC
  Administered 2015-12-28: 0.4 mg via INTRAVENOUS
  Filled 2015-12-28: qty 5

## 2015-12-28 MED ORDER — TECHNETIUM TC 99M TETROFOSMIN IV KIT
10.0000 | PACK | Freq: Once | INTRAVENOUS | Status: AC | PRN
  Administered 2015-12-28: 10 via INTRAVENOUS

## 2015-12-28 MED ORDER — HEPARIN BOLUS VIA INFUSION
3000.0000 [IU] | Freq: Once | INTRAVENOUS | Status: AC
  Administered 2015-12-28: 3000 [IU] via INTRAVENOUS
  Filled 2015-12-28: qty 3000

## 2015-12-28 NOTE — Progress Notes (Addendum)
ANTICOAGULATION CONSULT NOTE - Initial Consult  Pharmacy Consult for heparin Indication: chest pain/ACS  Not on File  Patient Measurements: Height: 5\' 7"  (170.2 cm) Weight: 128 lb 6.4 oz (58.242 kg) (scale b) IBW/kg (Calculated) : 61.6 Heparin Dosing Weight: 58kg  Vital Signs: BP: 124/75 mmHg (06/02 1210) Pulse Rate: 85 (06/02 1210)  Labs:  Recent Labs  12/26/15 2000 12/27/15 0230 12/27/15 0900 12/28/15 0430  HGB 12.0  --   --   --   HCT 37.1  --   --   --   PLT 151  --   --   --   CREATININE 0.77 0.74  --  0.83  TROPONINI 0.03 <0.03 <0.03  --     Estimated Creatinine Clearance: 61.3 mL/min (by C-G formula based on Cr of 0.83).  Assessment: 89 YOF here with bilateral leg edema. Surgery for pancreatic cancer at [facility] done 03/2015. Nuclear stress test shows inferior wall defect with reversible ischemia- plans for cath on Monday. To start heparin. She was not on anticoagulation PTA, and last received a dose of subq heparin at ~0615 this morning. Baseline Hgb 12, plts 151- no bleeding noted.  Goal of Therapy:  Heparin level 0.3-0.7 units/ml Monitor platelets by anticoagulation protocol: Yes   Plan:  -heparin bolus with 3000 units IV x1, then start heparin infusion at 750 units/hr -heparin level in 6h at midnight  -daily HL and CBC -follow up post-cath  Milas Schappell D. Neria Procter, PharmD, BCPS Clinical Pharmacist Pager: [phone] 12/28/2015 5:36 PM

## 2015-12-28 NOTE — Progress Notes (Signed)
Ref: Paul Trettin S, MD   Subjective:  Little dizzy in AM now feeling better with decreased dose of anti-hypertensive medications. Nuclear stress test shows inferior wall defect and reversible ischemia.  Objective:  Vital Signs in the last 24 hours: Temp:  [97.8 F (36.6 C)-98.2 F (36.8 C)] 98.2 F (36.8 C) (06/01 2333) Pulse Rate:  [57-103] 85 (06/02 1210) Cardiac Rhythm:  [-] Normal sinus rhythm (06/02 0704) Resp:  [18-20] 18 (06/02 0609) BP: (103-136)/(53-82) 124/75 mmHg (06/02 1210) SpO2:  [99 %-100 %] 100 % (06/02 0609) Weight:  [58.242 kg (128 lb 6.4 oz)] 58.242 kg (128 lb 6.4 oz) (06/02 [id number])  Physical Exam: BP Readings from Last 1 Encounters:  12/28/15 124/75    Wt Readings from Last 1 Encounters:  12/28/15 58.242 kg (128 lb 6.4 oz)    Weight change: -3.094 kg (-6 lb 13.1 oz)  HEENT: [place]/AT, Eyes-Brown, PERL, EOMI, Conjunctiva-Pink, Sclera-Non-icteric Neck: No JVD, No bruit, Trachea midline. Lungs:  Clear, Bilateral. Cardiac:  Regular rhythm, normal S1 and S2, no S3. II/VI systolic murmur. Abdomen:  Soft, non-tender. Extremities:  1 + ankle edema present. No cyanosis. No clubbing. CNS: AxOx3, Cranial nerves grossly intact, moves all 4 extremities. Right handed. Skin: Warm and dry.   Intake/Output from previous day: 06/01 0701 - 06/02 0700 In: 720 [P.O.:720] Out: 1700 [Urine:1700]    Lab Results: BMET    Component Value Date/Time   NA 141 12/28/2015 0430   NA 139 12/27/2015 0230   NA 138 12/26/2015 2000   K 3.9 12/28/2015 0430   K 2.8* 12/27/2015 0230   K 3.1* 12/26/2015 2000   CL 106 12/28/2015 0430   CL 101 12/27/2015 0230   CL 103 12/26/2015 2000   CO2 29 12/28/2015 0430   CO2 32 12/27/2015 0230   CO2 30 12/26/2015 2000   GLUCOSE 76 12/28/2015 0430   GLUCOSE 77 12/27/2015 0230   GLUCOSE 85 12/26/2015 2000   BUN 20 12/28/2015 0430   BUN 15 12/27/2015 0230   BUN 12 12/26/2015 2000   CREATININE 0.83 12/28/2015 0430   CREATININE 0.74 12/27/2015  0230   CREATININE 0.77 12/26/2015 2000   CALCIUM 8.8* 12/28/2015 0430   CALCIUM 8.6* 12/27/2015 0230   CALCIUM 8.7* 12/26/2015 2000   GFRNONAA >60 12/28/2015 0430   GFRNONAA >60 12/27/2015 0230   GFRNONAA >60 12/26/2015 2000   GFRAA >60 12/28/2015 0430   GFRAA >60 12/27/2015 0230   GFRAA >60 12/26/2015 2000   CBC    Component Value Date/Time   WBC 3.2* 12/26/2015 2000   WBC 5.1 08/31/2007 1136   RBC 4.08 12/26/2015 2000   RBC 4.29 08/31/2007 1136   HGB 12.0 12/26/2015 2000   HGB 12.0 08/31/2007 1136   HCT 37.1 12/26/2015 2000   HCT 35.8 08/31/2007 1136   PLT 151 12/26/2015 2000   PLT 247 08/31/2007 1136   MCV 90.9 12/26/2015 2000   MCV 83.4 08/31/2007 1136   MCH 29.4 12/26/2015 2000   MCH 28.0 08/31/2007 1136   MCHC 32.3 12/26/2015 2000   MCHC 33.6 08/31/2007 1136   RDW 14.8 12/26/2015 2000   RDW 14.9* 08/31/2007 1136   LYMPHSABS 0.4* 12/26/2015 2000   LYMPHSABS 1.2 08/31/2007 1136   MONOABS 0.4 12/26/2015 2000   MONOABS 0.5 08/31/2007 1136   EOSABS 0.3 12/26/2015 2000   EOSABS 0.2 08/31/2007 1136   BASOSABS 0.0 12/26/2015 2000   BASOSABS 0.0 08/31/2007 1136   HEPATIC Function Panel  Recent Labs  12/26/15 2000  PROT  5.1*   HEMOGLOBIN A1C No components found for: HGA1C,  MPG CARDIAC ENZYMES Lab Results  Component Value Date   TROPONINI <0.03 12/27/2015   TROPONINI <0.03 12/27/2015   TROPONINI 0.03 12/26/2015   BNP No results for input(s): PROBNP in the last 8760 hours. TSH  Recent Labs  12/26/15 2000  TSH 0.993   CHOLESTEROL No results for input(s): CHOL in the last 8760 hours.  Scheduled Meds: . aspirin EC  81 mg Oral Daily  . cycloSPORINE  1 drop Both Eyes Daily  . darifenacin  7.5 mg Oral Daily  . docusate sodium  100 mg Oral Daily  . ferrous sulfate  325 mg Oral Q breakfast  . heparin  5,000 Units Subcutaneous Q8H  . lipase/protease/amylase  12,000 Units Oral TID WC  . magnesium oxide  400 mg Oral Daily  . montelukast  10 mg Oral QHS   . multivitamin with minerals  1 tablet Oral Daily  . pantoprazole  40 mg Oral Daily  . sodium chloride flush  3 mL Intravenous Q12H   Continuous Infusions:  PRN Meds:.sodium chloride, acetaminophen, lidocaine, ondansetron (ZOFRAN) IV, sodium chloride flush, sodium chloride flush  Assessment/Plan: Bil. Leg edema Possible ischemic cardiomyopathy S/P pancreatic surgery for pancreatic cancer Severe protein calorie malnutrition Hypertension Hypoalbuminemia   IV heparin. Cardiac cath on Monday.     Dixie Dials  MD  12/28/2015, 5:20 PM

## 2015-12-29 DIAGNOSIS — I255 Ischemic cardiomyopathy: Secondary | ICD-10-CM | POA: Diagnosis present

## 2015-12-29 DIAGNOSIS — E876 Hypokalemia: Secondary | ICD-10-CM | POA: Diagnosis present

## 2015-12-29 DIAGNOSIS — E440 Moderate protein-calorie malnutrition: Secondary | ICD-10-CM | POA: Diagnosis present

## 2015-12-29 DIAGNOSIS — Z7982 Long term (current) use of aspirin: Secondary | ICD-10-CM | POA: Diagnosis not present

## 2015-12-29 DIAGNOSIS — Z91040 Latex allergy status: Secondary | ICD-10-CM | POA: Diagnosis not present

## 2015-12-29 DIAGNOSIS — Z79899 Other long term (current) drug therapy: Secondary | ICD-10-CM | POA: Diagnosis not present

## 2015-12-29 DIAGNOSIS — I10 Essential (primary) hypertension: Secondary | ICD-10-CM | POA: Diagnosis present

## 2015-12-29 DIAGNOSIS — Z91048 Other nonmedicinal substance allergy status: Secondary | ICD-10-CM | POA: Diagnosis not present

## 2015-12-29 DIAGNOSIS — Z9884 Bariatric surgery status: Secondary | ICD-10-CM | POA: Diagnosis not present

## 2015-12-29 DIAGNOSIS — Z888 Allergy status to other drugs, medicaments and biological substances status: Secondary | ICD-10-CM | POA: Diagnosis not present

## 2015-12-29 DIAGNOSIS — D72819 Decreased white blood cell count, unspecified: Secondary | ICD-10-CM | POA: Diagnosis present

## 2015-12-29 DIAGNOSIS — R600 Localized edema: Secondary | ICD-10-CM | POA: Diagnosis present

## 2015-12-29 DIAGNOSIS — Z90710 Acquired absence of both cervix and uterus: Secondary | ICD-10-CM | POA: Diagnosis not present

## 2015-12-29 DIAGNOSIS — Z8507 Personal history of malignant neoplasm of pancreas: Secondary | ICD-10-CM | POA: Diagnosis not present

## 2015-12-29 LAB — HEPARIN LEVEL (UNFRACTIONATED)
HEPARIN UNFRACTIONATED: 0.42 [IU]/mL (ref 0.30–0.70)
Heparin Unfractionated: 0.39 IU/mL (ref 0.30–0.70)

## 2015-12-29 LAB — CBC
HCT: 30.3 % — ABNORMAL LOW (ref 36.0–46.0)
Hemoglobin: 9.8 g/dL — ABNORMAL LOW (ref 12.0–15.0)
MCH: 29.3 pg (ref 26.0–34.0)
MCHC: 32.3 g/dL (ref 30.0–36.0)
MCV: 90.7 fL (ref 78.0–100.0)
PLATELETS: 181 10*3/uL (ref 150–400)
RBC: 3.34 MIL/uL — ABNORMAL LOW (ref 3.87–5.11)
RDW: 14.8 % (ref 11.5–15.5)
WBC: 3.9 10*3/uL — ABNORMAL LOW (ref 4.0–10.5)

## 2015-12-29 MED ORDER — HEPARIN SODIUM (PORCINE) 5000 UNIT/ML IJ SOLN
5000.0000 [IU] | Freq: Three times a day (TID) | INTRAMUSCULAR | Status: DC
  Administered 2015-12-29 – 2015-12-30 (×5): 5000 [IU] via SUBCUTANEOUS
  Filled 2015-12-29 (×6): qty 1

## 2015-12-29 NOTE — Progress Notes (Signed)
Subjective:  Patient denies any chest pain or shortness of breath states leg swelling has improved. Patient had nuclear stress test noted to have inferior wall reversible ischemia scheduled for a catheterization on Monday. Patient denies any abdominal pain and black tarry stools or bright red blood per rectum denies hematuria. Noted to have significant drop in hemoglobin.  Objective:  Vital Signs in the last 24 hours: Temp:  [97.5 F (36.4 C)-98.1 F (36.7 C)] 97.5 F (36.4 C) (06/03 0842) Pulse Rate:  [52-103] 68 (06/03 0842) Resp:  [16-17] 16 (06/03 0551) BP: (103-136)/(58-82) 118/58 mmHg (06/03 0842) SpO2:  [100 %] 100 % (06/03 0842) Weight:  [58.65 kg (129 lb 4.8 oz)] 58.65 kg (129 lb 4.8 oz) (06/03 0551)  Intake/Output from previous day: 06/02 0701 - 06/03 0700 In: 600 [P.O.:600] Out: 950 [Urine:950] Intake/Output from this shift:    Physical Exam: Neck: no adenopathy, no carotid bruit, no JVD and supple, symmetrical, trachea midline Lungs: clear to auscultation bilaterally Heart: regular rate and rhythm, S1, S2 normal and Soft systolic murmur noted Abdomen: soft, non-tender; bowel sounds normal; no masses,  no organomegaly Extremities: No clubbing cyanosis 1+ edema noted  Lab Results:  Recent Labs  12/26/15 2000 12/29/15 0302  WBC 3.2* 3.9*  HGB 12.0 9.8*  PLT 151 181    Recent Labs  12/27/15 0230 12/28/15 0430  NA 139 141  K 2.8* 3.9  CL 101 106  CO2 32 29  GLUCOSE 77 76  BUN 15 20  CREATININE 0.74 0.83    Recent Labs  12/27/15 0230 12/27/15 0900  TROPONINI <0.03 <0.03   Hepatic Function Panel  Recent Labs  12/26/15 2000  PROT 5.1*  ALBUMIN 2.7*  AST 28  ALT 26  ALKPHOS 40  BILITOT 0.6   No results for input(s): CHOL in the last 72 hours. No results for input(s): PROTIME in the last 72 hours.  Imaging: Imaging results have been reviewed and Nm Myocar Multi W/spect W/wall Motion / Ef  12/28/2015  CLINICAL DATA:  65 year old female with  a history of chest pain. Cardiovascular risk factors include known coronary artery disease with prior myocardial infarction, hypertension. EXAM: MYOCARDIAL IMAGING WITH SPECT (REST AND PHARMACOLOGIC-STRESS) GATED LEFT VENTRICULAR WALL MOTION STUDY LEFT VENTRICULAR EJECTION FRACTION TECHNIQUE: Standard myocardial SPECT imaging was performed after resting intravenous injection of 10 mCi Tc-22m tetrofosmin. Subsequently, intravenous infusion of Lexiscan was performed under the supervision of the Cardiology staff. At peak effect of the drug, 30 mCi Tc-96m tetrofosmin was injected intravenously and standard myocardial SPECT imaging was performed. Quantitative gated imaging was also performed to evaluate left ventricular wall motion, and estimate left ventricular ejection fraction. COMPARISON:  None. FINDINGS: Perfusion: Fixed defect of the inferior wall with perfusion deficit throughout. There is further decreased perfusion on the stress images involving the inferior wall from the base to apex. Wall Motion: Normal left ventricular wall motion. There appears to be transient ischemic dilation on stress images. Left Ventricular Ejection Fraction: 50 % End diastolic volume 89 ml End systolic volume 44 ml IMPRESSION: 1. Fixed defect of the inferior wall mid ventricle, with large reversible perfusion deficit on the stress images involving inferior wall from based apex. 2. Normal left ventricular wall motion, however, there appears to be transient ischemic dilation on the stress images. 3. Left ventricular ejection fraction 50% 4. Non invasive risk stratification*: High These results were called by telephone at the time of interpretation on 12/28/2015 at 5:00 pm to Dr. Dixie Dials , who verbally  acknowledged these results. *2012 Appropriate Use Criteria for Coronary Revascularization Focused Update: J Am Coll Cardiol. N6492421. http://[email or web address]?[email or web address] Electronically Signed   By:  Corrie Mckusick D.O.   On: 12/28/2015 17:00    Cardiac Studies:  Assessment/Plan:  Possible ischemic cardiomyopathy S/P pancreatic surgery for pancreatic cancer Severe protein calorie malnutrition Hypertension Hypoalbuminemia Resolving leg edema Acute anemia rule out GI loss Plan DC IV heparin Heparin subcutaneous for DVT prophylaxis Check stool for occult blood  Check labs in a.m.     Charolette Forward 12/29/2015, 9:43 AM

## 2015-12-29 NOTE — Progress Notes (Signed)
[facility] for heparin Indication: chest pain/ACS  Not on File  Patient Measurements: Height: 5\' 7"  (170.2 cm) Weight: 128 lb 6.4 oz (58.242 kg) (scale b) IBW/kg (Calculated) : 61.6 Heparin Dosing Weight: 58kg  Vital Signs: Temp: 98.1 F (36.7 C) (06/02 2235) Temp Source: Oral (06/02 2235) BP: 118/65 mmHg (06/02 2235) Pulse Rate: 56 (06/02 2235)  Labs:  Recent Labs  12/26/15 2000 12/27/15 0230 12/27/15 0900 12/28/15 0430 12/29/15 0037  HGB 12.0  --   --   --   --   HCT 37.1  --   --   --   --   PLT 151  --   --   --   --   HEPARINUNFRC  --   --   --   --  0.42  CREATININE 0.77 0.74  --  0.83  --   TROPONINI 0.03 <0.03 <0.03  --   --     Estimated Creatinine Clearance: 61.3 mL/min (by C-G formula based on Cr of 0.83).  Assessment: 72 YOF here with bilateral leg edema. Surgery for pancreatic cancer at [place] done 03/2015. Nuclear stress test shows inferior wall defect with reversible ischemia- plans for cath on Monday. Marland Kitchen She was not on anticoagulation PTA, and last received a dose of subq heparin at ~0615 this morning. Baseline Hgb 12, plts 151- no bleeding noted. First HL therapeutic at 0.42 after 3000 unit bolus and drip at 750 units/hr.  No bleeding reported.  Goal of Therapy:  Heparin level 0.3-0.7 units/ml Monitor platelets by anticoagulation protocol: Yes   Plan:  -continue start heparin infusion at 750 units/hr -daily HL and CBC -follow up [place], Pharm.D. BP:[id number] 12/29/2015 1:36 AM

## 2015-12-29 NOTE — Progress Notes (Signed)
[facility] for heparin Indication: chest pain/ACS  Not on File  Patient Measurements: Height: 5\' 7"  (170.2 cm) Weight: 129 lb 4.8 oz (58.65 kg) (b scale) IBW/kg (Calculated) : 61.6 Heparin Dosing Weight: 58kg  Vital Signs: Temp: 97.7 F (36.5 C) (06/03 0551) Temp Source: Oral (06/03 0551) BP: 103/63 mmHg (06/03 0551) Pulse Rate: 52 (06/03 0551)  Labs:  Recent Labs  12/26/15 2000 12/27/15 0230 12/27/15 0900 12/28/15 0430 12/29/15 0037 12/29/15 0302  HGB 12.0  --   --   --   --  9.8*  HCT 37.1  --   --   --   --  30.3*  PLT 151  --   --   --   --  181  HEPARINUNFRC  --   --   --   --  0.42 0.39  CREATININE 0.77 0.74  --  0.83  --   --   TROPONINI 0.03 <0.03 <0.03  --   --   --     Estimated Creatinine Clearance: 61.8 mL/min (by C-G formula based on Cr of 0.83).  Assessment: 25 YOF here with bilateral leg edema. Surgery for pancreatic cancer at [place] done 03/2015. Nuclear stress test shows inferior wall defect with reversible ischemia- plans for cath 6/5. HL remains therapeutic at 0.39. Hgb 12>9.8, plt 151>181. No overt bleeding reported.  Goal of Therapy:  Heparin level 0.3-0.7 units/ml Monitor platelets by anticoagulation protocol: Yes   Plan:  Continue heparin infusion at 750 units/hr Daily HL and CBC Monitor s/sx bleeding Follow up [place], PharmD Clinical Pharmacy Resident 7:48 AM, 12/29/2015

## 2015-12-30 LAB — BASIC METABOLIC PANEL
Anion gap: 3 — ABNORMAL LOW (ref 5–15)
BUN: 18 mg/dL (ref 6–20)
CALCIUM: 9 mg/dL (ref 8.9–10.3)
CHLORIDE: 107 mmol/L (ref 101–111)
CO2: 30 mmol/L (ref 22–32)
CREATININE: 0.74 mg/dL (ref 0.44–1.00)
GFR calc Af Amer: >60 mL/min (ref >60)
GFR calc non Af Amer: >60 mL/min (ref >60)
Glucose, Bld: 73 mg/dL (ref 65–99)
Potassium: 4.2 mmol/L (ref 3.5–5.1)
Sodium: 140 mmol/L (ref 135–145)

## 2015-12-30 LAB — CBC
HEMATOCRIT: 29.8 % — AB (ref 36.0–46.0)
HEMOGLOBIN: 9.7 g/dL — AB (ref 12.0–15.0)
MCH: 29.8 pg (ref 26.0–34.0)
MCHC: 32.6 g/dL (ref 30.0–36.0)
MCV: 91.4 fL (ref 78.0–100.0)
Platelets: 174 10*3/uL (ref 150–400)
RBC: 3.26 MIL/uL — ABNORMAL LOW (ref 3.87–5.11)
RDW: 14.9 % (ref 11.5–15.5)
WBC: 3.9 10*3/uL — ABNORMAL LOW (ref 4.0–10.5)

## 2015-12-30 LAB — OCCULT BLOOD X 1 CARD TO LAB, STOOL: Fecal Occult Bld: NEGATIVE

## 2015-12-30 MED ORDER — SODIUM CHLORIDE 0.9% FLUSH: 3.0000 mL | INTRAVENOUS | Status: DC | PRN

## 2015-12-30 MED ORDER — SODIUM CHLORIDE 0.9% FLUSH
3.0000 mL | Freq: Two times a day (BID) | INTRAVENOUS | Status: DC
  Administered 2015-12-30: 3 mL via INTRAVENOUS

## 2015-12-30 MED ORDER — SODIUM CHLORIDE 0.9 % WEIGHT BASED INFUSION
3.0000 mL/kg/h | INTRAVENOUS | Status: AC
  Administered 2015-12-31: 3 mL/kg/h via INTRAVENOUS

## 2015-12-30 MED ORDER — SODIUM CHLORIDE 0.9 % IV SOLN: 250.0000 mL | INTRAVENOUS | Status: DC | PRN

## 2015-12-30 MED ORDER — SODIUM CHLORIDE 0.9 % WEIGHT BASED INFUSION
1.0000 mL/kg/h | INTRAVENOUS | Status: DC
  Administered 2015-12-31: 1 mL/kg/h via INTRAVENOUS

## 2015-12-30 NOTE — Progress Notes (Signed)
Subjective:  Patient denies any chest pain or shortness of breath. No further drop in hemoglobin.  Objective:  Vital Signs in the last 24 hours: Temp:  [97.5 F (36.4 C)-98.2 F (36.8 C)] 98.1 F (36.7 C) (06/04 0450) Pulse Rate:  [58-68] 60 (06/04 0450) Resp:  [18] 18 (06/04 0450) BP: (104-123)/(58-69) 123/69 mmHg (06/04 0450) SpO2:  [100 %] 100 % (06/04 0450) Weight:  [59.9 kg (132 lb 0.9 oz)] 59.9 kg (132 lb 0.9 oz) (06/04 0450)  Intake/Output from previous day: 06/03 0701 - 06/04 0700 In: 640 [P.O.:640] Out: 550 [Urine:550] Intake/Output from this shift:    Physical Exam: Neck: no adenopathy, no carotid bruit, no JVD and supple, symmetrical, trachea midline Lungs: clear to auscultation bilaterally Heart: regular rate and rhythm, S1, S2 normal and Soft systolic murmur noted Abdomen: soft, non-tender; bowel sounds normal; no masses,  no organomegaly Extremities: extremities normal, atraumatic, no cyanosis or edema  Lab Results:  Recent Labs  12/29/15 0302 12/30/15 0539  WBC 3.9* 3.9*  HGB 9.8* 9.7*  PLT 181 174    Recent Labs  12/28/15 0430 12/30/15 0539  NA 141 140  K 3.9 4.2  CL 106 107  CO2 29 30  GLUCOSE 76 73  BUN 20 18  CREATININE 0.83 0.74    Recent Labs  12/27/15 0900  TROPONINI <0.03   Hepatic Function Panel No results for input(s): PROT, ALBUMIN, AST, ALT, ALKPHOS, BILITOT, BILIDIR, IBILI in the last 72 hours. No results for input(s): CHOL in the last 72 hours. No results for input(s): PROTIME in the last 72 hours.  Imaging: Imaging results have been reviewed and Nm Myocar Multi W/spect W/wall Motion / Ef  12/28/2015  CLINICAL DATA:  65 year old female with a history of chest pain. Cardiovascular risk factors include known coronary artery disease with prior myocardial infarction, hypertension. EXAM: MYOCARDIAL IMAGING WITH SPECT (REST AND PHARMACOLOGIC-STRESS) GATED LEFT VENTRICULAR WALL MOTION STUDY LEFT VENTRICULAR EJECTION FRACTION  TECHNIQUE: Standard myocardial SPECT imaging was performed after resting intravenous injection of 10 mCi Tc-45m tetrofosmin. Subsequently, intravenous infusion of Lexiscan was performed under the supervision of the Cardiology staff. At peak effect of the drug, 30 mCi Tc-3m tetrofosmin was injected intravenously and standard myocardial SPECT imaging was performed. Quantitative gated imaging was also performed to evaluate left ventricular wall motion, and estimate left ventricular ejection fraction. COMPARISON:  None. FINDINGS: Perfusion: Fixed defect of the inferior wall with perfusion deficit throughout. There is further decreased perfusion on the stress images involving the inferior wall from the base to apex. Wall Motion: Normal left ventricular wall motion. There appears to be transient ischemic dilation on stress images. Left Ventricular Ejection Fraction: 50 % End diastolic volume 89 ml End systolic volume 44 ml IMPRESSION: 1. Fixed defect of the inferior wall mid ventricle, with large reversible perfusion deficit on the stress images involving inferior wall from based apex. 2. Normal left ventricular wall motion, however, there appears to be transient ischemic dilation on the stress images. 3. Left ventricular ejection fraction 50% 4. Non invasive risk stratification*: High These results were called by telephone at the time of interpretation on 12/28/2015 at 5:00 pm to Dr. Dixie Dials , who verbally acknowledged these results. *2012 Appropriate Use Criteria for Coronary Revascularization Focused Update: J Am Coll Cardiol. N6492421. http://[email or web address]?[email or web address] Electronically Signed   By: Corrie Mckusick D.O.   On: 12/28/2015 17:00    Cardiac Studies:  Assessment/Plan:  Possible coronary artery disease with abnormal nuclear stress test. S/P pancreatic surgery for  pancreatic cancer Severe protein calorie malnutrition Hypertension Hypoalbuminemia Resolving leg  edema Acute anemia rule out GI loss Plan Add amlodipine 2.5 mg daily Scheduled for cardiac catheterization tomorrow   LOS: 1 day    Charolette Forward 12/30/2015, 7:41 AM

## 2015-12-31 ENCOUNTER — Encounter (HOSPITAL_COMMUNITY)
Admission: AD | Disposition: A | Discharge status: Home / Self Care | Payer: Self-pay | Source: Ambulatory Visit | Attending: Cardiovascular Disease

## 2015-12-31 ENCOUNTER — Encounter (HOSPITAL_COMMUNITY): Payer: Self-pay | Admitting: Cardiovascular Disease

## 2015-12-31 HISTORY — PX: CARDIAC CATHETERIZATION: SHX172

## 2015-12-31 LAB — BASIC METABOLIC PANEL
ANION GAP: 4 — AB (ref 5–15)
BUN: 17 mg/dL (ref 6–20)
CALCIUM: 9.1 mg/dL (ref 8.9–10.3)
CO2: 29 mmol/L (ref 22–32)
Chloride: 108 mmol/L (ref 101–111)
Creatinine, Ser: 0.68 mg/dL (ref 0.44–1.00)
Glucose, Bld: 79 mg/dL (ref 65–99)
Potassium: 4 mmol/L (ref 3.5–5.1)
SODIUM: 141 mmol/L (ref 135–145)

## 2015-12-31 LAB — CBC
HEMATOCRIT: 31.3 % — AB (ref 36.0–46.0)
Hemoglobin: 10 g/dL — ABNORMAL LOW (ref 12.0–15.0)
MCH: 29.1 pg (ref 26.0–34.0)
MCHC: 31.9 g/dL (ref 30.0–36.0)
MCV: 91 fL (ref 78.0–100.0)
PLATELETS: 188 10*3/uL (ref 150–400)
RBC: 3.44 MIL/uL — ABNORMAL LOW (ref 3.87–5.11)
RDW: 15.1 % (ref 11.5–15.5)
WBC: 4.2 10*3/uL (ref 4.0–10.5)

## 2015-12-31 LAB — PROTIME-INR
INR: 1.04 (ref 0.00–1.49)
Prothrombin Time: 13.8 seconds (ref 11.6–15.2)

## 2015-12-31 SURGERY — LEFT HEART CATH AND CORONARY ANGIOGRAPHY
Anesthesia: LOCAL

## 2015-12-31 MED ORDER — MIDAZOLAM HCL 2 MG/2ML IJ SOLN
INTRAMUSCULAR | Status: AC
  Filled 2015-12-31: qty 2

## 2015-12-31 MED ORDER — HEPARIN SOD (PORK) LOCK FLUSH 100 UNIT/ML IV SOLN
500.0000 [IU] | INTRAVENOUS | Status: AC | PRN
  Administered 2015-12-31: 500 [IU]

## 2015-12-31 MED ORDER — SODIUM CHLORIDE 0.9% FLUSH: 3.0000 mL | INTRAVENOUS | Status: DC | PRN

## 2015-12-31 MED ORDER — LIDOCAINE HCL (PF) 1 % IJ SOLN
INTRAMUSCULAR | Status: AC
  Filled 2015-12-31: qty 30

## 2015-12-31 MED ORDER — LIDOCAINE HCL (PF) 1 % IJ SOLN
INTRAMUSCULAR | Status: DC | PRN
  Administered 2015-12-31: 20 mL

## 2015-12-31 MED ORDER — SODIUM CHLORIDE 0.9% FLUSH: 3.0000 mL | Freq: Two times a day (BID) | INTRAVENOUS | Status: DC

## 2015-12-31 MED ORDER — MIDAZOLAM HCL 2 MG/2ML IJ SOLN
INTRAMUSCULAR | Status: DC | PRN
  Administered 2015-12-31: 1 mg via INTRAVENOUS

## 2015-12-31 MED ORDER — FENTANYL CITRATE (PF) 100 MCG/2ML IJ SOLN
INTRAMUSCULAR | Status: AC
  Filled 2015-12-31: qty 2

## 2015-12-31 MED ORDER — SODIUM CHLORIDE 0.9 % IV SOLN: INTRAVENOUS | Status: DC

## 2015-12-31 MED ORDER — IOPAMIDOL (ISOVUE-370) INJECTION 76%
INTRAVENOUS | Status: DC | PRN
  Administered 2015-12-31: 40 mL via INTRA_ARTERIAL

## 2015-12-31 MED ORDER — IOPAMIDOL (ISOVUE-370) INJECTION 76%
INTRAVENOUS | Status: AC
  Filled 2015-12-31: qty 100

## 2015-12-31 MED ORDER — HEPARIN SODIUM (PORCINE) 1000 UNIT/ML IJ SOLN
INTRAMUSCULAR | Status: DC | PRN
  Administered 2015-12-31: 1000 [IU] via INTRAVENOUS

## 2015-12-31 MED ORDER — SODIUM CHLORIDE 0.9 % IV SOLN: 250.0000 mL | INTRAVENOUS | Status: DC | PRN

## 2015-12-31 MED ORDER — HEPARIN (PORCINE) IN NACL 2-0.9 UNIT/ML-% IJ SOLN
INTRAMUSCULAR | Status: AC
  Filled 2015-12-31: qty 1000

## 2015-12-31 MED ORDER — FENTANYL CITRATE (PF) 100 MCG/2ML IJ SOLN
INTRAMUSCULAR | Status: DC | PRN
  Administered 2015-12-31: 25 ug via INTRAVENOUS

## 2015-12-31 SURGICAL SUPPLY — 6 items
CATH INFINITI 5FR MULTPACK ANG (CATHETERS) ×1 IMPLANT
KIT HEART LEFT (KITS) ×2 IMPLANT
PACK CARDIAC CATHETERIZATION (CUSTOM PROCEDURE TRAY) ×2 IMPLANT
SHEATH PINNACLE 5F 10CM (SHEATH) ×1 IMPLANT
TRANSDUCER W/STOPCOCK (MISCELLANEOUS) ×2 IMPLANT
WIRE EMERALD 3MM-J .035X150CM (WIRE) ×1 IMPLANT

## 2015-12-31 NOTE — Interval H&P Note (Signed)
History and Physical Interval Note:  12/31/2015 9:58 AM  Rebekah Steele  has presented today for surgery, with the diagnosis of cp  The various methods of treatment have been discussed with the patient and family. After consideration of risks, benefits and other options for treatment, the patient has consented to  Procedure(s): Left Heart Cath and Coronary Angiography (N/A) as a surgical intervention .  The patient's history has been reviewed, patient examined, no change in status, stable for surgery.  I have reviewed the patient's chart and labs.  Questions were answered to the patient's satisfaction.     Alyah Boehning S

## 2015-12-31 NOTE — Care Management Important Message (Signed)
Important Message  Patient Details  Name: Rebekah Steele MRN: [id number] Date of Birth: [birth year]   Medicare Important Message Given:  Yes    Loann Quill 12/31/2015, 11:49 AM

## 2015-12-31 NOTE — H&P (View-Only) (Signed)
Ref: Addis Bennie S, MD   Subjective:  Little dizzy in AM now feeling better with decreased dose of anti-hypertensive medications. Nuclear stress test shows inferior wall defect and reversible ischemia.  Objective:  Vital Signs in the last 24 hours: Temp:  [97.8 F (36.6 C)-98.2 F (36.8 C)] 98.2 F (36.8 C) (06/01 2333) Pulse Rate:  [57-103] 85 (06/02 1210) Cardiac Rhythm:  [-] Normal sinus rhythm (06/02 0704) Resp:  [18-20] 18 (06/02 0609) BP: (103-136)/(53-82) 124/75 mmHg (06/02 1210) SpO2:  [99 %-100 %] 100 % (06/02 0609) Weight:  [58.242 kg (128 lb 6.4 oz)] 58.242 kg (128 lb 6.4 oz) (06/02 [id number])  Physical Exam: BP Readings from Last 1 Encounters:  12/28/15 124/75    Wt Readings from Last 1 Encounters:  12/28/15 58.242 kg (128 lb 6.4 oz)    Weight change: -3.094 kg (-6 lb 13.1 oz)  HEENT: [place]/AT, Eyes-Brown, PERL, EOMI, Conjunctiva-Pink, Sclera-Non-icteric Neck: No JVD, No bruit, Trachea midline. Lungs:  Clear, Bilateral. Cardiac:  Regular rhythm, normal S1 and S2, no S3. II/VI systolic murmur. Abdomen:  Soft, non-tender. Extremities:  1 + ankle edema present. No cyanosis. No clubbing. CNS: AxOx3, Cranial nerves grossly intact, moves all 4 extremities. Right handed. Skin: Warm and dry.   Intake/Output from previous day: 06/01 0701 - 06/02 0700 In: 720 [P.O.:720] Out: 1700 [Urine:1700]    Lab Results: BMET    Component Value Date/Time   NA 141 12/28/2015 0430   NA 139 12/27/2015 0230   NA 138 12/26/2015 2000   K 3.9 12/28/2015 0430   K 2.8* 12/27/2015 0230   K 3.1* 12/26/2015 2000   CL 106 12/28/2015 0430   CL 101 12/27/2015 0230   CL 103 12/26/2015 2000   CO2 29 12/28/2015 0430   CO2 32 12/27/2015 0230   CO2 30 12/26/2015 2000   GLUCOSE 76 12/28/2015 0430   GLUCOSE 77 12/27/2015 0230   GLUCOSE 85 12/26/2015 2000   BUN 20 12/28/2015 0430   BUN 15 12/27/2015 0230   BUN 12 12/26/2015 2000   CREATININE 0.83 12/28/2015 0430   CREATININE 0.74 12/27/2015  0230   CREATININE 0.77 12/26/2015 2000   CALCIUM 8.8* 12/28/2015 0430   CALCIUM 8.6* 12/27/2015 0230   CALCIUM 8.7* 12/26/2015 2000   GFRNONAA >60 12/28/2015 0430   GFRNONAA >60 12/27/2015 0230   GFRNONAA >60 12/26/2015 2000   GFRAA >60 12/28/2015 0430   GFRAA >60 12/27/2015 0230   GFRAA >60 12/26/2015 2000   CBC    Component Value Date/Time   WBC 3.2* 12/26/2015 2000   WBC 5.1 08/31/2007 1136   RBC 4.08 12/26/2015 2000   RBC 4.29 08/31/2007 1136   HGB 12.0 12/26/2015 2000   HGB 12.0 08/31/2007 1136   HCT 37.1 12/26/2015 2000   HCT 35.8 08/31/2007 1136   PLT 151 12/26/2015 2000   PLT 247 08/31/2007 1136   MCV 90.9 12/26/2015 2000   MCV 83.4 08/31/2007 1136   MCH 29.4 12/26/2015 2000   MCH 28.0 08/31/2007 1136   MCHC 32.3 12/26/2015 2000   MCHC 33.6 08/31/2007 1136   RDW 14.8 12/26/2015 2000   RDW 14.9* 08/31/2007 1136   LYMPHSABS 0.4* 12/26/2015 2000   LYMPHSABS 1.2 08/31/2007 1136   MONOABS 0.4 12/26/2015 2000   MONOABS 0.5 08/31/2007 1136   EOSABS 0.3 12/26/2015 2000   EOSABS 0.2 08/31/2007 1136   BASOSABS 0.0 12/26/2015 2000   BASOSABS 0.0 08/31/2007 1136   HEPATIC Function Panel  Recent Labs  12/26/15 2000  PROT  5.1*   HEMOGLOBIN A1C No components found for: HGA1C,  MPG CARDIAC ENZYMES Lab Results  Component Value Date   TROPONINI <0.03 12/27/2015   TROPONINI <0.03 12/27/2015   TROPONINI 0.03 12/26/2015   BNP No results for input(s): PROBNP in the last 8760 hours. TSH  Recent Labs  12/26/15 2000  TSH 0.993   CHOLESTEROL No results for input(s): CHOL in the last 8760 hours.  Scheduled Meds: . aspirin EC  81 mg Oral Daily  . cycloSPORINE  1 drop Both Eyes Daily  . darifenacin  7.5 mg Oral Daily  . docusate sodium  100 mg Oral Daily  . ferrous sulfate  325 mg Oral Q breakfast  . heparin  5,000 Units Subcutaneous Q8H  . lipase/protease/amylase  12,000 Units Oral TID WC  . magnesium oxide  400 mg Oral Daily  . montelukast  10 mg Oral QHS   . multivitamin with minerals  1 tablet Oral Daily  . pantoprazole  40 mg Oral Daily  . sodium chloride flush  3 mL Intravenous Q12H   Continuous Infusions:  PRN Meds:.sodium chloride, acetaminophen, lidocaine, ondansetron (ZOFRAN) IV, sodium chloride flush, sodium chloride flush  Assessment/Plan: Bil. Leg edema Possible ischemic cardiomyopathy S/P pancreatic surgery for pancreatic cancer Severe protein calorie malnutrition Hypertension Hypoalbuminemia   IV heparin. Cardiac cath on Monday.     Dixie Dials  MD  12/28/2015, 5:20 PM

## 2015-12-31 NOTE — Progress Notes (Signed)
Site area: Scientific laboratory technician Prior to Removal:  Level 0 Pressure Applied For: 25 min Manual:   yes Patient Status During Pull:  A/O Post Pull Site:  Level 0 Post Pull Instructions Given:  Pt understands post instructions Post Pull Pulses Present: 2+ rt dp Dressing Applied:  tegaderm and a 4x4 Bedrest begins @ 11:05:00 Comments: Pt leaves cath lab  ha in stable condition. Rt groin unremarkable.

## 2015-12-31 NOTE — Discharge Summary (Signed)
Physician Discharge Summary  Patient ID: Rebekah Steele MRN: [id number] DOB/AGE: 1950-11-18 65 y.o.  Admit date: 12/26/2015 Discharge date: 12/31/2015  Admission Diagnoses: Bilateral leg edema Possible ischemic cardiomyopathy S/P pancreatic surgery for pancreatic cancer Hypertension  Discharge Diagnoses:  Principal Problem: * Bilateral leg edema * Active Problems:   Personal history of pancreatic cancer   Abnormal weight loss   Malnutrition of moderate degree   Hypertension   Hypoalbuminemia   Hypokalemia-resolved   Acute anemia rule out GI blood loss   Mild leukopenia  Discharged Condition: fair  Hospital Course: 65 year old female with PMH of Hypertension, s/p hysterectomy, Breast cancer surgery, GB surgery-1986, Gastric bypass 2008 and Pancreatic surgery for cancer at [facility] 03/2015 has abnormal weight loss, shortness of breath and bilateral leg edema without chest pain. She had good diuresis with IV lasix use, IV albumin use and Ted hose use. Her initial cardiac work up suggested inferior wall ischemia but her cardiac catheterization showed normal coronaries. Her hypokalemia was treated with potassium supplementation and discontinuation of hydrochlorothiazide. She was discharged home in stable condition with follow up by me in 1 week.  Consults: cardiology  Significant Diagnostic Studies: labs: Near normal CBC except mild leukopenia. Normal BMET except potassium of 3.1 mmol. Normal troponin-I, BNP and TSH.  Echocardiogram:  - Left ventricle: The cavity size was normal. There was mild concentric hypertrophy. Systolic function was normal. The estimated ejection fraction was in the range of 55% to 60%. Wall motion was normal; there were no regional wall motion abnormalities. Doppler parameters are consistent with abnormal left ventricular relaxation (grade 1 diastolic dysfunction). - Mitral valve: Calcified annulus. There was mild regurgitation. - Left atrium: The atrium was mildly  dilated. - Right atrium: The atrium was mildly dilated. - Pulmonary arteries: Systolic pressure was mildly increased. PA peak pressure: 36 mm Hg (S).  Cardiac catheterization: Normal coronaries.  Treatments: cardiac meds: amlodipine and aspirin. Other medications: Iron, Creon, Magnesium, Singulair, Omeprazole, Naproxen, Vesicare and Tylenol.  Discharge Exam: Blood pressure 125/75, pulse 66, temperature 98 F (36.7 C), temperature source Oral, resp. rate 15, height 5\' 7"  (1.702 m), weight 58.605 kg (129 lb 3.2 oz), SpO2 100 %. HEENT: [place]/AT, Eyes-Brown, PERL, EOMI, Conjunctiva-Pink, Sclera-Non-icteric Neck: No JVD, No bruit, Trachea midline. Lungs: Clear, Bilateral. Cardiac: Regular rhythm, normal S1 and S2, no S3. II/VI systolic murmur. Abdomen: Soft, non-tender. Extremities: 1 + edema present. No cyanosis. No clubbing. No right groin hematoma. CNS: AxOx3, Cranial nerves grossly intact, moves all 4 extremities. Right handed. Skin: Warm and dry.  Disposition:  01, Home or self care.     Medication List    STOP taking these medications        hydrochlorothiazide 12.5 MG capsule  Commonly known as:  MICROZIDE     potassium chloride 10 MEQ CR capsule  Commonly known as:  MICRO-K      TAKE these medications        amLODipine 5 MG tablet  Commonly known as:  NORVASC  Take 2.5 mg by mouth daily.     aspirin EC 81 MG tablet  Take 81 mg by mouth daily.     cycloSPORINE 0.05 % ophthalmic emulsion  Commonly known as:  RESTASIS  Place 1 drop into both eyes daily.     docusate calcium 240 MG capsule  Commonly known as:  SURFAK  Take 240 mg by mouth daily.     fluocinonide cream 0.05 %  Commonly known as:  LIDEX  Apply 1  application topically 2 (two) times daily.     fluticasone 50 MCG/ACT nasal spray  Commonly known as:  FLONASE  Place 1 spray into the nose daily as needed. allergies     Iron 325 (65 Fe) MG Tabs  Take 325 mg by mouth daily.      lipase/protease/amylase 12000 units Cpep capsule  Commonly known as:  CREON  Take 12,000 Units by mouth 3 (three) times daily with meals.     magnesium oxide 400 MG tablet  Commonly known as:  MAG-OX  Take 400 mg by mouth daily.     MIACALCIN 200 UNIT/ACT nasal spray  Generic drug:  calcitonin (salmon)  Place 1 spray into alternate nostrils daily as needed (allergies).     montelukast 10 MG tablet  Commonly known as:  SINGULAIR  Take 10 mg by mouth at bedtime.     MULTI-VITAMINS Tabs  Take 1 tablet by mouth daily.     naproxen 250 MG tablet  Commonly known as:  NAPROSYN  Take 250 mg by mouth 2 (two) times daily as needed for mild pain.     omeprazole 40 MG capsule  Commonly known as:  PRILOSEC  Take 40 mg by mouth daily.     solifenacin 10 MG tablet  Commonly known as:  VESICARE  Take 10 mg by mouth daily.     TYLENOL 500 MG tablet  Generic drug:  acetaminophen  Take 500 mg by mouth every 6 (six) hours as needed. pain           Follow-up Information    Follow up with [facility] S, MD. Schedule an appointment as soon as possible for a visit in 1 week.   Specialty:  Cardiology   Contact information:   [place] [address] [phone]       Signed: Birdie Riddle 12/31/2015, 7:54 PM

## 2016-01-02 MED FILL — Heparin Sodium (Porcine) 2 Unit/ML in Sodium Chloride 0.9%: INTRAMUSCULAR | Qty: 1000 | Status: AC

## 2017-02-25 ENCOUNTER — Observation Stay (HOSPITAL_COMMUNITY)
Admission: AD | Admit: 2017-02-25 | Discharge: 2017-02-26 | Disposition: A | Discharge status: Home / Self Care | Payer: Medicare Other | Source: Ambulatory Visit | Attending: Cardiovascular Disease | Admitting: Cardiovascular Disease

## 2017-02-25 ENCOUNTER — Observation Stay (HOSPITAL_COMMUNITY): Payer: Medicare Other

## 2017-02-25 DIAGNOSIS — D638 Anemia in other chronic diseases classified elsewhere: Secondary | ICD-10-CM | POA: Insufficient documentation

## 2017-02-25 DIAGNOSIS — R072 Precordial pain: Principal | ICD-10-CM | POA: Diagnosis present

## 2017-02-25 DIAGNOSIS — G500 Trigeminal neuralgia: Secondary | ICD-10-CM | POA: Diagnosis not present

## 2017-02-25 DIAGNOSIS — D72819 Decreased white blood cell count, unspecified: Secondary | ICD-10-CM | POA: Insufficient documentation

## 2017-02-25 DIAGNOSIS — R0602 Shortness of breath: Secondary | ICD-10-CM | POA: Diagnosis not present

## 2017-02-25 DIAGNOSIS — I10 Essential (primary) hypertension: Secondary | ICD-10-CM | POA: Insufficient documentation

## 2017-02-25 DIAGNOSIS — Z7982 Long term (current) use of aspirin: Secondary | ICD-10-CM | POA: Insufficient documentation

## 2017-02-25 DIAGNOSIS — K219 Gastro-esophageal reflux disease without esophagitis: Secondary | ICD-10-CM | POA: Diagnosis not present

## 2017-02-25 DIAGNOSIS — R600 Localized edema: Secondary | ICD-10-CM | POA: Insufficient documentation

## 2017-02-25 DIAGNOSIS — E440 Moderate protein-calorie malnutrition: Secondary | ICD-10-CM | POA: Diagnosis not present

## 2017-02-25 DIAGNOSIS — Z79899 Other long term (current) drug therapy: Secondary | ICD-10-CM | POA: Insufficient documentation

## 2017-02-25 DIAGNOSIS — Z8507 Personal history of malignant neoplasm of pancreas: Secondary | ICD-10-CM | POA: Diagnosis present

## 2017-02-25 DIAGNOSIS — Z853 Personal history of malignant neoplasm of breast: Secondary | ICD-10-CM | POA: Diagnosis not present

## 2017-02-25 DIAGNOSIS — Z6821 Body mass index (BMI) 21.0-21.9, adult: Secondary | ICD-10-CM | POA: Insufficient documentation

## 2017-02-25 LAB — CBC WITH DIFFERENTIAL/PLATELET
BASOS ABS: 0 10*3/uL (ref 0.0–0.1)
Basophils Relative: 0 %
Eosinophils Absolute: 0.2 10*3/uL (ref 0.0–0.7)
Eosinophils Relative: 5 %
HEMATOCRIT: 35.2 % — AB (ref 36.0–46.0)
HEMOGLOBIN: 11.4 g/dL — AB (ref 12.0–15.0)
LYMPHS PCT: 25 %
Lymphs Abs: 1 10*3/uL (ref 0.7–4.0)
MCH: 28.9 pg (ref 26.0–34.0)
MCHC: 32.4 g/dL (ref 30.0–36.0)
MCV: 89.1 fL (ref 78.0–100.0)
Monocytes Absolute: 0.4 10*3/uL (ref 0.1–1.0)
Monocytes Relative: 10 %
NEUTROS ABS: 2.3 10*3/uL (ref 1.7–7.7)
Neutrophils Relative %: 60 %
Platelets: 217 10*3/uL (ref 150–400)
RBC: 3.95 MIL/uL (ref 3.87–5.11)
RDW: 13.5 % (ref 11.5–15.5)
WBC: 3.9 10*3/uL — AB (ref 4.0–10.5)

## 2017-02-25 LAB — COMPREHENSIVE METABOLIC PANEL
ALBUMIN: 3.3 g/dL — AB (ref 3.5–5.0)
ALK PHOS: 63 U/L (ref 38–126)
ALT: 19 U/L (ref 14–54)
ANION GAP: 5 (ref 5–15)
AST: 22 U/L (ref 15–41)
BUN: 14 mg/dL (ref 6–20)
CO2: 29 mmol/L (ref 22–32)
Calcium: 8.7 mg/dL — ABNORMAL LOW (ref 8.9–10.3)
Chloride: 104 mmol/L (ref 101–111)
Creatinine, Ser: 0.84 mg/dL (ref 0.44–1.00)
GFR calc Af Amer: >60 mL/min (ref >60)
GFR calc non Af Amer: >60 mL/min (ref >60)
GLUCOSE: 161 mg/dL — AB (ref 65–99)
POTASSIUM: 3.6 mmol/L (ref 3.5–5.1)
SODIUM: 138 mmol/L (ref 135–145)
Total Bilirubin: 0.7 mg/dL (ref 0.3–1.2)
Total Protein: 5.9 g/dL — ABNORMAL LOW (ref 6.5–8.1)

## 2017-02-25 LAB — BRAIN NATRIURETIC PEPTIDE: B NATRIURETIC PEPTIDE 5: 28.8 pg/mL (ref 0.0–100.0)

## 2017-02-25 LAB — TROPONIN I
Troponin I: <0.03 ng/mL (ref <0.03)
Troponin I: <0.03 ng/mL (ref <0.03)

## 2017-02-25 MED ORDER — ASPIRIN 300 MG RE SUPP
300.0000 mg | RECTAL | Status: AC
  Filled 2017-02-25: qty 1

## 2017-02-25 MED ORDER — HEPARIN (PORCINE) IN NACL 100-0.45 UNIT/ML-% IJ SOLN
900.0000 [IU]/h | INTRAMUSCULAR | Status: DC
  Administered 2017-02-25: 750 [IU]/h via INTRAVENOUS
  Filled 2017-02-25: qty 250

## 2017-02-25 MED ORDER — ATORVASTATIN CALCIUM 20 MG PO TABS
20.0000 mg | ORAL_TABLET | Freq: Every day | ORAL | Status: DC
  Administered 2017-02-26: 20 mg via ORAL
  Filled 2017-02-25: qty 1

## 2017-02-25 MED ORDER — HEPARIN BOLUS VIA INFUSION
4000.0000 [IU] | Freq: Once | INTRAVENOUS | Status: AC
  Administered 2017-02-25: 4000 [IU] via INTRAVENOUS
  Filled 2017-02-25: qty 4000

## 2017-02-25 MED ORDER — ASPIRIN EC 81 MG PO TBEC: 81.0000 mg | DELAYED_RELEASE_TABLET | Freq: Every day | ORAL | Status: DC

## 2017-02-25 MED ORDER — AMLODIPINE BESYLATE 2.5 MG PO TABS: 2.5000 mg | ORAL_TABLET | Freq: Every day | ORAL | Status: DC

## 2017-02-25 MED ORDER — ASPIRIN 81 MG PO CHEW
324.0000 mg | CHEWABLE_TABLET | ORAL | Status: AC
  Administered 2017-02-25: 324 mg via ORAL
  Filled 2017-02-25: qty 4

## 2017-02-25 MED ORDER — MONTELUKAST SODIUM 10 MG PO TABS: 10.0000 mg | ORAL_TABLET | Freq: Every day | ORAL | Status: DC

## 2017-02-25 MED ORDER — ACETAMINOPHEN 325 MG PO TABS: 650.0000 mg | ORAL_TABLET | ORAL | Status: DC | PRN

## 2017-02-25 MED ORDER — METOPROLOL TARTRATE 12.5 MG HALF TABLET
12.5000 mg | ORAL_TABLET | Freq: Two times a day (BID) | ORAL | Status: DC
  Administered 2017-02-25 – 2017-02-26 (×2): 12.5 mg via ORAL
  Filled 2017-02-25 (×2): qty 1

## 2017-02-25 MED ORDER — IRON 325 (65 FE) MG PO TABS: 325.0000 mg | ORAL_TABLET | Freq: Every day | ORAL | Status: DC

## 2017-02-25 MED ORDER — PANCRELIPASE (LIP-PROT-AMYL) 12000-38000 UNITS PO CPEP
12000.0000 [IU] | ORAL_CAPSULE | Freq: Three times a day (TID) | ORAL | Status: DC
  Administered 2017-02-26: 12000 [IU] via ORAL
  Filled 2017-02-25: qty 1

## 2017-02-25 MED ORDER — CYCLOSPORINE 0.05 % OP EMUL
1.0000 [drp] | Freq: Every day | OPHTHALMIC | Status: DC
  Filled 2017-02-25: qty 1

## 2017-02-25 MED ORDER — SODIUM CHLORIDE 0.9% FLUSH
10.0000 mL | INTRAVENOUS | Status: DC | PRN
  Administered 2017-02-26 (×2): 10 mL
  Filled 2017-02-25 (×2): qty 40

## 2017-02-25 MED ORDER — MAGNESIUM OXIDE 400 MG PO TABS: 400.0000 mg | ORAL_TABLET | Freq: Every day | ORAL | Status: DC

## 2017-02-25 MED ORDER — DOCUSATE SODIUM 100 MG PO CAPS
100.0000 mg | ORAL_CAPSULE | Freq: Every day | ORAL | Status: DC
  Administered 2017-02-25 – 2017-02-26 (×2): 100 mg via ORAL
  Filled 2017-02-25 (×2): qty 1

## 2017-02-25 MED ORDER — LIDOCAINE-PRILOCAINE 2.5-2.5 % EX CREA
TOPICAL_CREAM | CUTANEOUS | Status: DC | PRN
  Administered 2017-02-25: 20:00:00 via TOPICAL
  Filled 2017-02-25: qty 5

## 2017-02-25 MED ORDER — NITROGLYCERIN 0.4 MG SL SUBL: 0.4000 mg | SUBLINGUAL_TABLET | SUBLINGUAL | Status: DC | PRN

## 2017-02-25 MED ORDER — ONDANSETRON HCL 4 MG/2ML IJ SOLN: 4.0000 mg | Freq: Four times a day (QID) | INTRAMUSCULAR | Status: DC | PRN

## 2017-02-25 MED ORDER — PANTOPRAZOLE SODIUM 40 MG PO TBEC
40.0000 mg | DELAYED_RELEASE_TABLET | Freq: Every day | ORAL | Status: DC
  Administered 2017-02-25: 40 mg via ORAL
  Filled 2017-02-25: qty 1

## 2017-02-25 NOTE — Progress Notes (Signed)
Direct admit from home.  Arrived to unit in no acute distress; husband present at bedside.  Dr. Doylene Canard notified of pt arrival; MD to come see pt.  Oriented to room and unit with understanding verbalized. VSS.  Awaiting admission orders.  Will continue to monitor.

## 2017-02-25 NOTE — Progress Notes (Signed)
ANTICOAGULATION CONSULT NOTE - Initial Consult  Pharmacy Consult for Heparin Indication: chest pain/ACS  Not on File  Patient Measurements: Height: 5\' 7"  (170.2 cm) Weight: 140 lb 3.2 oz (63.6 kg) (scale b) IBW/kg (Calculated) : 61.6  Vital Signs: Temp: 98.2 F (36.8 C) (08/01 1718) Temp Source: Oral (08/01 1718) BP: 154/82 (08/01 1718) Pulse Rate: 57 (08/01 1718)  Labs: No results for input(s): HGB, HCT, PLT, APTT, LABPROT, INR, HEPARINUNFRC, HEPRLOWMOCWT, CREATININE, CKTOTAL, CKMB, TROPONINI in the last 72 hours.  CrCl cannot be calculated (Patient's most recent lab result is older than the maximum 21 days allowed.).   Medical History: Past Medical History:  Diagnosis Date  . Bilateral leg edema 11/2015  . Cancer [facility])    breast & pancreatic  . GERD (gastroesophageal reflux disease)   . Hypertension   . Trigeminal neuralgia    Assessment: 66yof to begin heparin for r/o ACS. Cath 6/5 showed normal coronaries. Plan is for stress test tomorrow. Baseline labs pending. No anticoagulants pta. Previously therapeutic on 750 units/hr.   Goal of Therapy:  Heparin level 0.3-0.7 units/ml Monitor platelets by anticoagulation protocol: Yes   Plan:  1) Heparin bolus 4000 units x 1 2) Heparin drip at 750 units/hr 3) 6 hour heparin level 4) Daily heparin level and CBC  Deboraha Sprang 02/25/2017,6:31 PM

## 2017-02-25 NOTE — H&P (Signed)
Referring Physician:  [name] is an 66 y.o. female.                       Chief Complaint: Shortness of breath, chest pain and leg edema.  HPI: 66 year old female had leg edema, shortness of breath and chest pain. Her leg edema improved with increasing lasix dose but shortness of breath and chest pain continues. Her PMH include hypertension, S/P hysterectomy, breast cancer surgery, GB surgery, Gastric bypass amd pancreatic cancer surgery.   Past Medical History:  Diagnosis Date  . Bilateral leg edema 11/2015  . Cancer [facility])    breast & pancreatic  . GERD (gastroesophageal reflux disease)   . Hypertension   . Trigeminal neuralgia       Past Surgical History:  Procedure Laterality Date  . ABDOMINAL HYSTERECTOMY    . BREAST LUMPECTOMY    . CARDIAC CATHETERIZATION N/A 12/31/2015   Procedure: Left Heart Cath and Coronary Angiography;  Surgeon: Dixie Dials, MD;  Location: [place] [facility];  Service: Cardiovascular;  Laterality: N/A;  . CHOLECYSTECTOMY    . FACIAL NERVE SURGERY      No family history on file. Social History:  reports that she has never smoked. She has never used smokeless tobacco. She reports that she does not drink alcohol or use drugs.  Allergies: Not on File  Medications Prior to Admission  Medication Sig Dispense Refill  . acetaminophen (TYLENOL) 500 MG tablet Take 500 mg by mouth every 6 (six) hours as needed. pain    . amLODipine (NORVASC) 5 MG tablet Take 2.5 mg by mouth daily.     Marland Kitchen aspirin EC 81 MG tablet Take 81 mg by mouth daily.    . calcitonin, salmon, (MIACALCIN) 200 UNIT/ACT nasal spray Place 1 spray into alternate nostrils daily as needed (allergies).    . cycloSPORINE (RESTASIS) 0.05 % ophthalmic emulsion Place 1 drop into both eyes daily.    Marland Kitchen docusate calcium (SURFAK) 240 MG capsule Take 240 mg by mouth daily.    . Ferrous Sulfate (IRON) 325 (65 Fe) MG TABS Take 325 mg by mouth daily.    . fluocinonide cream (LIDEX) 1.61 % Apply 1  application topically 2 (two) times daily.    . fluticasone (FLONASE) 50 MCG/ACT nasal spray Place 1 spray into the nose daily as needed. allergies    . lipase/protease/amylase (CREON) 12000 units CPEP capsule Take 12,000 Units by mouth 3 (three) times daily with meals.    . magnesium oxide (MAG-OX) 400 MG tablet Take 400 mg by mouth daily.    . montelukast (SINGULAIR) 10 MG tablet Take 10 mg by mouth at bedtime.    . Multiple Vitamin (MULTI-VITAMINS) TABS Take 1 tablet by mouth daily.    . naproxen (NAPROSYN) 250 MG tablet Take 250 mg by mouth 2 (two) times daily as needed for mild pain.    Marland Kitchen omeprazole (PRILOSEC) 40 MG capsule Take 40 mg by mouth daily.    . solifenacin (VESICARE) 10 MG tablet Take 10 mg by mouth daily.      No results found for this or any previous visit (from the past 48 hour(s)). No results found.  Review Of Systems Constitutional: No fever, chills, positive chronic weight loss. Eyes: No vision change, wears glasses. No discharge or pain. Ears: No hearing loss, No tinnitus. Respiratory: No asthma, COPD, pneumonias. Positive shortness of breath. No hemoptysis. Cardiovascular: No chest pain, palpitation, positive leg edema. Gastrointestinal: No nausea,  vomiting, diarrhea, constipation. No GI bleed. No hepatitis. Genitourinary: No dysuria, hematuria, kidney stone. No incontinance. Neurological: No headache, stroke, seizures.  Psychiatry: No psych facility admission for anxiety, depression, suicide. No detox. Skin: No rash. Musculoskeletal: Positive joint pain, no fibromyalgia. No neck pain, back pain. Lymphadenopathy: No lymphadenopathy. Hematology: No anemia or easy bruising.   Blood pressure (!) 154/82, pulse (!) 57, temperature 98.2 F (36.8 C), temperature source Oral, resp. rate 18, height 5\' 7"  (1.702 m), weight 63.6 kg (140 lb 3.2 oz), SpO2 98 %. Body mass index is 21.96 kg/m. General appearance: alert, cooperative, appears stated age and no distress Head:  Normocephalic, atraumatic. Eyes: Brown eyes, pink conjunctiva, corneas clear. PERRL, EOM's intact. Neck: No adenopathy, no carotid bruit, no JVD, supple, symmetrical, trachea midline and thyroid not enlarged. Resp: Clear to auscultation bilaterally. Cardio: Regular rate and rhythm, S1, S2 normal, II/VI systolic murmur, no click, rub or gallop GI: Soft, non-tender; bowel sounds normal; no organomegaly. Extremities: Trace edema, cyanosis or clubbing. Skin: Warm and dry.  Neurologic: Alert and oriented X 3, normal strength. Normal coordination and gait.  Assessment/Plan Chest pain r/o MI Shortness of breath r/o CHF S/P Pancreatic surgery for cancer S/P breast surgery for cancer Hypertension Moderate protein calorie malnutrition  Place in observation. Blood work. Nuclear stress test in AM  [facility] S, MD  02/25/2017, 5:59 PM

## 2017-02-26 ENCOUNTER — Observation Stay (HOSPITAL_COMMUNITY): Payer: Medicare Other

## 2017-02-26 DIAGNOSIS — R072 Precordial pain: Secondary | ICD-10-CM | POA: Diagnosis not present

## 2017-02-26 LAB — LIPID PANEL
Cholesterol: 153 mg/dL (ref 0–200)
HDL: 73 mg/dL (ref >40)
LDL Cholesterol: 73 mg/dL (ref 0–99)
TRIGLYCERIDES: 37 mg/dL (ref <150)
Total CHOL/HDL Ratio: 2.1 RATIO
VLDL: 7 mg/dL (ref 0–40)

## 2017-02-26 LAB — CBC
HEMATOCRIT: 35.2 % — AB (ref 36.0–46.0)
Hemoglobin: 11 g/dL — ABNORMAL LOW (ref 12.0–15.0)
MCH: 27.8 pg (ref 26.0–34.0)
MCHC: 31.3 g/dL (ref 30.0–36.0)
MCV: 88.9 fL (ref 78.0–100.0)
Platelets: 223 10*3/uL (ref 150–400)
RBC: 3.96 MIL/uL (ref 3.87–5.11)
RDW: 13.2 % (ref 11.5–15.5)
WBC: 3.7 10*3/uL — ABNORMAL LOW (ref 4.0–10.5)

## 2017-02-26 LAB — BASIC METABOLIC PANEL
Anion gap: 6 (ref 5–15)
BUN: 16 mg/dL (ref 6–20)
CALCIUM: 8.5 mg/dL — AB (ref 8.9–10.3)
CO2: 29 mmol/L (ref 22–32)
Chloride: 108 mmol/L (ref 101–111)
Creatinine, Ser: 0.87 mg/dL (ref 0.44–1.00)
GFR calc Af Amer: >60 mL/min (ref >60)
GLUCOSE: 88 mg/dL (ref 65–99)
Potassium: 3.5 mmol/L (ref 3.5–5.1)
Sodium: 143 mmol/L (ref 135–145)

## 2017-02-26 LAB — PROTIME-INR
INR: 1.06
PROTHROMBIN TIME: 13.9 s (ref 11.4–15.2)

## 2017-02-26 LAB — HEPARIN LEVEL (UNFRACTIONATED): HEPARIN UNFRACTIONATED: 0.16 [IU]/mL — AB (ref 0.30–0.70)

## 2017-02-26 LAB — TROPONIN I

## 2017-02-26 MED ORDER — HEPARIN SOD (PORK) LOCK FLUSH 100 UNIT/ML IV SOLN
500.0000 [IU] | INTRAVENOUS | Status: AC | PRN
  Administered 2017-02-26: 500 [IU]

## 2017-02-26 MED ORDER — LISINOPRIL 2.5 MG PO TABS: 2.5000 mg | ORAL_TABLET | Freq: Every day | ORAL | 3 refills | Status: DC

## 2017-02-26 MED ORDER — REGADENOSON 0.4 MG/5ML IV SOLN
0.4000 mg | Freq: Once | INTRAVENOUS | Status: AC
  Administered 2017-02-26: 0.4 mg via INTRAVENOUS
  Filled 2017-02-26: qty 5

## 2017-02-26 MED ORDER — METOPROLOL TARTRATE 25 MG PO TABS: 12.5000 mg | ORAL_TABLET | Freq: Two times a day (BID) | ORAL | 3 refills | Status: DC

## 2017-02-26 MED ORDER — TECHNETIUM TC 99M TETROFOSMIN IV KIT
10.0000 | PACK | Freq: Once | INTRAVENOUS | Status: AC | PRN
  Administered 2017-02-26: 10 via INTRAVENOUS

## 2017-02-26 MED ORDER — REGADENOSON 0.4 MG/5ML IV SOLN
INTRAVENOUS | Status: AC
  Administered 2017-02-26: 0.4 mg via INTRAVENOUS
  Filled 2017-02-26: qty 5

## 2017-02-26 MED ORDER — TECHNETIUM TC 99M TETROFOSMIN IV KIT: 30.0000 | PACK | Freq: Once | INTRAVENOUS | Status: DC | PRN

## 2017-02-26 NOTE — Progress Notes (Signed)
ANTICOAGULATION CONSULT NOTE - Follow Up Consult  Pharmacy Consult for Heparin  Indication: chest pain/ACS  No Known Allergies  Patient Measurements: Height: 5\' 7"  (170.2 cm) Weight: 140 lb 3.2 oz (63.6 kg) IBW/kg (Calculated) : 61.6  Vital Signs: Temp: 98.3 F (36.8 C) (08/02 0505) Temp Source: Oral (08/02 0505) BP: 114/65 (08/02 0505) Pulse Rate: 50 (08/02 0505)  Labs:  Recent Labs  02/25/17 2125 02/26/17 0412 02/26/17 0441  HGB 11.4* 11.0*  --   HCT 35.2* 35.2*  --   PLT 217 223  --   LABPROT  --   --  13.9  INR  --   --  1.06  HEPARINUNFRC  --   --  0.16*  CREATININE 0.84 0.87  --   TROPONINI <0.03  <0.03 <0.03  --     Estimated Creatinine Clearance: 61 mL/min (by C-G formula based on SCr of 0.87 mg/dL).   Assessment: Heparin for CP/shortness of breath, likely NST today, heparin level is low this AM, no issues per RN.   Goal of Therapy:  Heparin level 0.3-0.7 units/ml Monitor platelets by anticoagulation protocol: Yes   Plan:  Inc heparin to 900 units/hr 1400 HL  Pankaj Haack 02/26/2017,5:54 AM

## 2017-02-26 NOTE — Discharge Summary (Signed)
Physician Discharge Summary  Patient ID: Rebekah Steele MRN: [id number] DOB/AGE: [birth year] 66 y.o.  Admit date: 02/25/2017 Discharge date: 02/26/2017  Admission Diagnoses: Chest pain rule out MI Shortness of breath rule out CHF Status post pancreatic surgery for cancer Status post breast surgery for cancer Hypertension Moderate protein calorie malnutrition  Discharge Diagnoses:  Principal Problem:   Chest pain, precordial, MI ruled out Active Problems:   Personal history of pancreatic cancer   Malnutrition of moderate degree   Hypertension   Personal history of breast cancer   Shortness of breath, CHF ruled out   Anemia of chronic disease  Discharged Condition: fair  Hospital Course: 66 year old female with a past history of hypertension, hysterectomy, personal history of breast cancer and pancreatic cancer, gallbladder surgery, gastric bypass surgery and hysterectomy had leg edema for 1 week with shortness of breath and chest pain. Her leg edema improved with increasing Lasix dose but she had ongoing chest pain and shortness of breath. Her chest x-ray was unremarkable. Her BNP and cardiac enzymes were normal and her nuclear stress test was of suboptimal quality on the nuclear images with a normal stress test part during Lexiscan infusion.  She had normal coronaries on cardiac catheterization about a year ago. She will be treated medically, discharged home and followed by me in 1 week.  Consults: cardiology  Significant Diagnostic Studies: labs: Her CBC showed mild leukopenia which is chronic and mild anemia, again chronic. Her BMET was normal. Her BNP, troponin I 3 and a lipid panel was normal  EKG was normal.  Chest x-ray was unremarkable with aortic atherosclerosis.  Nuclear medicine myocardial multi with respect with wall motion/EF showed equivocal finding along the apical inferior wall with a minimal hypokinesia of the inferior wall and ejection fraction of 51%. Myocardial  uptake was patchy on both rest and the stress images and orientation of the left ventricle was slightly different between rest and stress images.  Treatments: cardiac meds: furosemide, Lisinopril, metoprolol and aspirin.  Discharge Exam: Blood pressure 140/79, pulse 75, temperature 97.9 F (36.6 C), temperature source Oral, resp. rate 18, height 5\' 7"  (1.702 m), weight 63.6 kg (140 lb 3.2 oz), SpO2 100 %. General appearance: alert, cooperative and appears stated age. Head: Normocephalic, atraumatic. Eyes: Brown/Blue/Hazel eyes, pink conjunctiva, corneas clear. PERRL, EOM's intact.  Neck: No adenopathy, no carotid bruit, no JVD, supple, symmetrical, trachea midline and thyroid not enlarged. Resp: Clear to auscultation bilaterally. Cardio: Regular rate and rhythm, S1, S2 normal, II/VI systolic murmur, no click, rub or gallop. GI: Soft, non-tender; bowel sounds normal; no organomegaly. Extremities: No edema, cyanosis or clubbing. Skin: Warm and dry.  Neurologic: Alert and oriented X 3, normal strength and tone. Normal coordination and gait.  Disposition: 01-Home or Self Care   Allergies as of 02/26/2017   No Known Allergies     Medication List    STOP taking these medications   naproxen 250 MG tablet Commonly known as:  NAPROSYN     TAKE these medications   aspirin EC 81 MG tablet Take 81 mg by mouth daily.   cycloSPORINE 0.05 % ophthalmic emulsion Commonly known as:  RESTASIS Place 1 drop into both eyes daily.   docusate calcium 240 MG capsule Commonly known as:  SURFAK Take 240 mg by mouth daily.   Ferrous Sulfate 90 (18 Fe) MG Tabs Take 90 mg by mouth every morning.   fluocinonide cream 0.05 % Commonly known as:  LIDEX Apply 1 application topically 2 (two) times  daily.   fluticasone 50 MCG/ACT nasal spray Commonly known as:  FLONASE Place 1 spray into the nose daily as needed. allergies   furosemide 40 MG tablet Commonly known as:  LASIX Take 40 mg by mouth  daily as needed for fluid.   gabapentin 300 MG capsule Commonly known as:  NEURONTIN Take 300 mg by mouth 3 (three) times daily.   Glycerin-Hypromellose-PEG 400 0.2-0.2-1 % Soln Apply 2 drops to eye 2 (two) times daily as needed. Dry eyes   hydrochlorothiazide 12.5 MG capsule Commonly known as:  MICROZIDE Take 12.5 mg by mouth daily.   ipratropium 0.06 % nasal spray Commonly known as:  ATROVENT Place 2 sprays into both nostrils 3 (three) times daily.   lidocaine-prilocaine cream Commonly known as:  EMLA Apply 1 application topically as needed.   lipase/protease/amylase 12000 units Cpep capsule Commonly known as:  CREON Take 12,000 Units by mouth 3 (three) times daily before meals.   lisinopril 2.5 MG tablet Commonly known as:  PRINIVIL,ZESTRIL Take 1 tablet (2.5 mg total) by mouth daily.   meclizine 12.5 MG tablet Commonly known as:  ANTIVERT Take 12.5 mg by mouth 2 (two) times daily as needed for dizziness.   meloxicam 7.5 MG tablet Commonly known as:  MOBIC Take 7.5 mg by mouth daily.   metoprolol tartrate 25 MG tablet Commonly known as:  LOPRESSOR Take 0.5 tablets (12.5 mg total) by mouth 2 (two) times daily.   MIACALCIN 200 UNIT/ACT nasal spray Generic drug:  calcitonin (salmon) Place 1 spray into alternate nostrils daily as needed (allergies).   MULTI-VITAMINS Tabs Take 1 tablet by mouth daily.   omeprazole 40 MG capsule Commonly known as:  PRILOSEC Take 40 mg by mouth daily.   oxycodone 5 MG capsule Commonly known as:  OXY-IR Take 5 mg by mouth every 4 (four) hours as needed for pain.   solifenacin 10 MG tablet Commonly known as:  VESICARE Take 10 mg by mouth daily.   TYLENOL 500 MG tablet Generic drug:  acetaminophen Take 500 mg by mouth every 6 (six) hours as needed. pain      Follow-up Information    Dixie Dials, MD. Schedule an appointment as soon as possible for a visit in 1 week(s).   Specialty:  Cardiology Contact information: [place] [address] [phone]           Signed: Birdie Riddle 02/26/2017, 5:39 PM

## 2018-07-12 IMAGING — CR DG CHEST 2V
2 series · 2 of 2 positions shown · non-contrast
Comparison: Prior radiograph from 12/27/2015.

CLINICAL DATA: Initial evaluation for acute shortness of breath.

EXAM:
CHEST  2 VIEW

[chest pa]
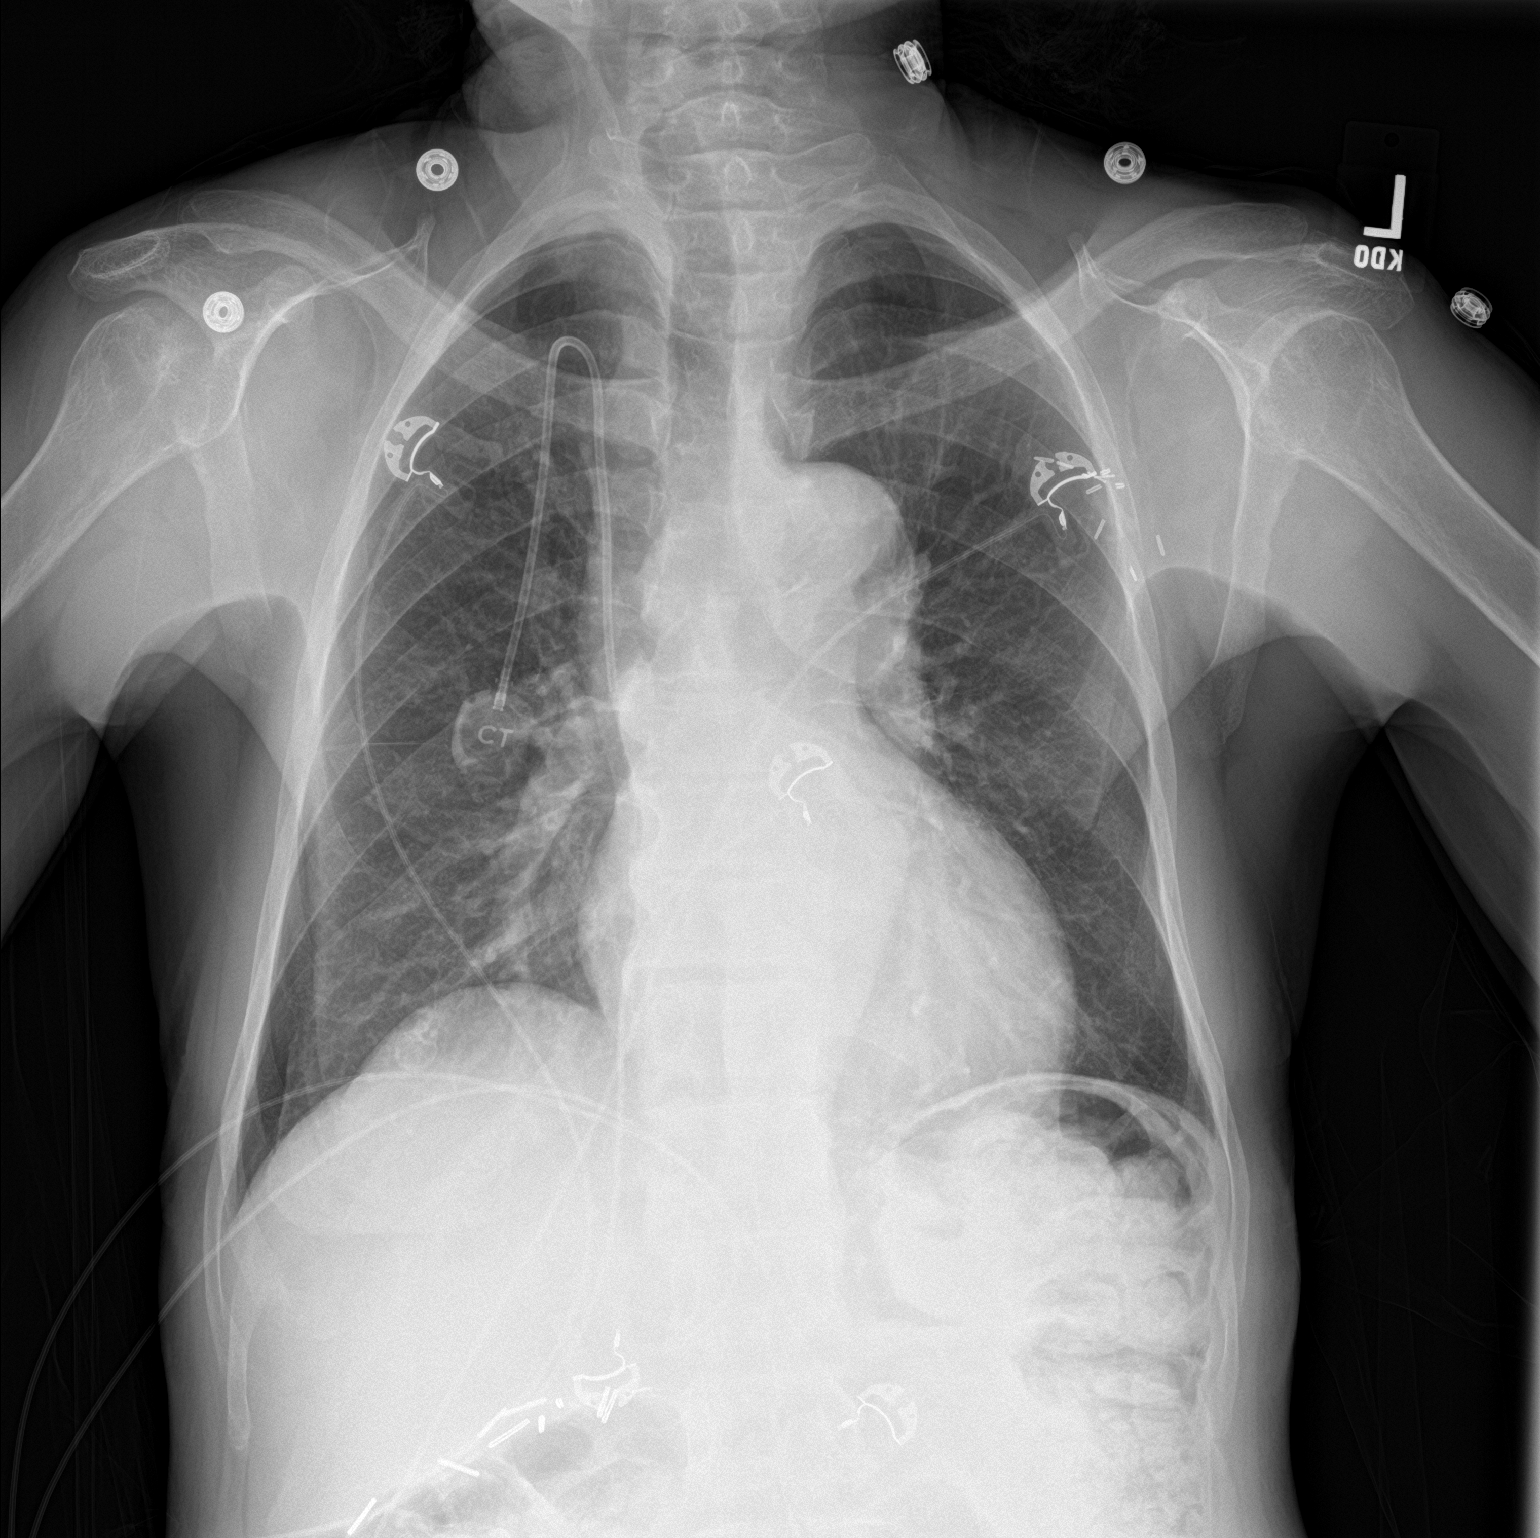

[chest lat]
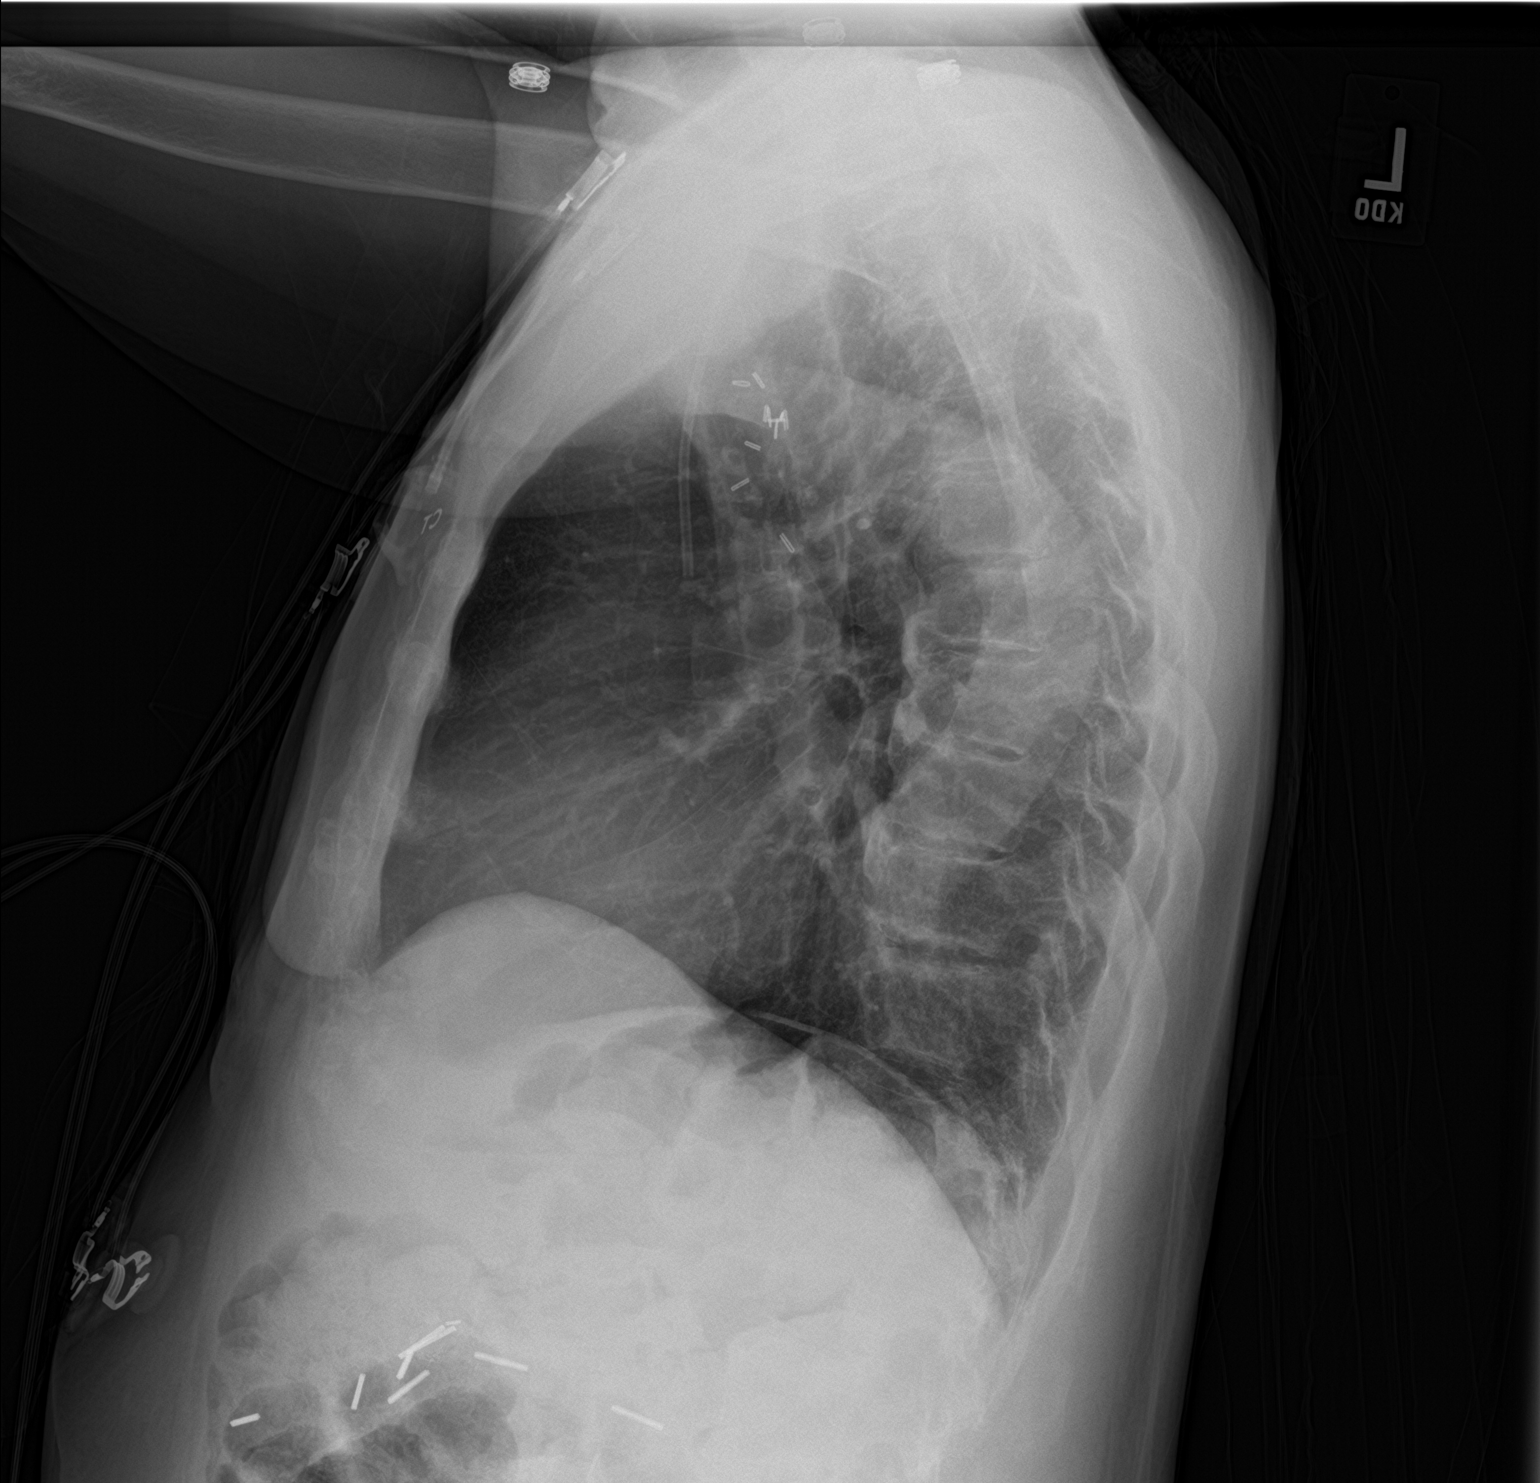

[2 of 2 positions shown; findings below may reference images not displayed]

FINDINGS: Right-sided Port-A-Cath in place. Transverse heart size at the upper
limits of normal. Mediastinal silhouette within normal limits.
Aortic atherosclerosis.

Lungs normally inflated. No focal infiltrates. No pulmonary edema or
pleural effusion. No pneumothorax.

No acute osseous abnormality. Multilevel degenerative changes noted
within the visualized spine. Surgical clips overlie the left axilla
and right upper quadrant.
IMPRESSION: 1. No active cardiopulmonary disease.
2. Aortic atherosclerosis.

## 2019-09-19 ENCOUNTER — Inpatient Hospital Stay (HOSPITAL_COMMUNITY)
Admission: AD | Admit: 2019-09-19 | Discharge: 2019-09-22 | DRG: 149 | Disposition: A | Discharge status: Home / Self Care | Payer: BC Managed Care – PPO | Source: Ambulatory Visit | Attending: Cardiovascular Disease | Admitting: Cardiovascular Disease

## 2019-09-19 ENCOUNTER — Observation Stay (HOSPITAL_COMMUNITY): Payer: BC Managed Care – PPO

## 2019-09-19 DIAGNOSIS — K219 Gastro-esophageal reflux disease without esophagitis: Secondary | ICD-10-CM | POA: Diagnosis present

## 2019-09-19 DIAGNOSIS — Z8507 Personal history of malignant neoplasm of pancreas: Secondary | ICD-10-CM

## 2019-09-19 DIAGNOSIS — R42 Dizziness and giddiness: Secondary | ICD-10-CM

## 2019-09-19 DIAGNOSIS — H814 Vertigo of central origin: Principal | ICD-10-CM | POA: Diagnosis present

## 2019-09-19 DIAGNOSIS — Z79899 Other long term (current) drug therapy: Secondary | ICD-10-CM

## 2019-09-19 DIAGNOSIS — G9389 Other specified disorders of brain: Secondary | ICD-10-CM | POA: Diagnosis present

## 2019-09-19 DIAGNOSIS — N182 Chronic kidney disease, stage 2 (mild): Secondary | ICD-10-CM | POA: Diagnosis present

## 2019-09-19 DIAGNOSIS — E440 Moderate protein-calorie malnutrition: Secondary | ICD-10-CM | POA: Diagnosis present

## 2019-09-19 DIAGNOSIS — G500 Trigeminal neuralgia: Secondary | ICD-10-CM | POA: Diagnosis present

## 2019-09-19 DIAGNOSIS — Z853 Personal history of malignant neoplasm of breast: Secondary | ICD-10-CM

## 2019-09-19 DIAGNOSIS — I129 Hypertensive chronic kidney disease with stage 1 through stage 4 chronic kidney disease, or unspecified chronic kidney disease: Secondary | ICD-10-CM | POA: Diagnosis present

## 2019-09-19 DIAGNOSIS — Z7982 Long term (current) use of aspirin: Secondary | ICD-10-CM

## 2019-09-19 DIAGNOSIS — D631 Anemia in chronic kidney disease: Secondary | ICD-10-CM | POA: Diagnosis present

## 2019-09-19 DIAGNOSIS — Z791 Long term (current) use of non-steroidal anti-inflammatories (NSAID): Secondary | ICD-10-CM

## 2019-09-19 DIAGNOSIS — Z6820 Body mass index (BMI) 20.0-20.9, adult: Secondary | ICD-10-CM

## 2019-09-19 DIAGNOSIS — E1122 Type 2 diabetes mellitus with diabetic chronic kidney disease: Secondary | ICD-10-CM | POA: Diagnosis present

## 2019-09-19 DIAGNOSIS — Z90710 Acquired absence of both cervix and uterus: Secondary | ICD-10-CM

## 2019-09-19 DIAGNOSIS — Z20822 Contact with and (suspected) exposure to covid-19: Secondary | ICD-10-CM | POA: Diagnosis present

## 2019-09-19 DIAGNOSIS — R55 Syncope and collapse: Secondary | ICD-10-CM | POA: Diagnosis present

## 2019-09-19 LAB — URINALYSIS, ROUTINE W REFLEX MICROSCOPIC
Bacteria, UA: NONE SEEN
Bilirubin Urine: NEGATIVE
Glucose, UA: NEGATIVE mg/dL
Hgb urine dipstick: NEGATIVE
Ketones, ur: NEGATIVE mg/dL
Nitrite: NEGATIVE
Protein, ur: NEGATIVE mg/dL
Specific Gravity, Urine: 1.012 (ref 1.005–1.030)
pH: 5 (ref 5.0–8.0)

## 2019-09-19 LAB — CBC WITH DIFFERENTIAL/PLATELET
Abs Immature Granulocytes: 0 10*3/uL (ref 0.00–0.07)
Basophils Absolute: 0 10*3/uL (ref 0.0–0.1)
Basophils Relative: 1 %
Eosinophils Absolute: 0.2 10*3/uL (ref 0.0–0.5)
Eosinophils Relative: 4 %
HCT: 36.1 % (ref 36.0–46.0)
Hemoglobin: 11.2 g/dL — ABNORMAL LOW (ref 12.0–15.0)
Immature Granulocytes: 0 %
Lymphocytes Relative: 20 %
Lymphs Abs: 0.9 10*3/uL (ref 0.7–4.0)
MCH: 28.4 pg (ref 26.0–34.0)
MCHC: 31 g/dL (ref 30.0–36.0)
MCV: 91.4 fL (ref 80.0–100.0)
Monocytes Absolute: 0.4 10*3/uL (ref 0.1–1.0)
Monocytes Relative: 9 %
Neutro Abs: 2.8 10*3/uL (ref 1.7–7.7)
Neutrophils Relative %: 66 %
Platelets: 240 10*3/uL (ref 150–400)
RBC: 3.95 MIL/uL (ref 3.87–5.11)
RDW: 13.1 % (ref 11.5–15.5)
WBC: 4.3 10*3/uL (ref 4.0–10.5)
nRBC: 0 % (ref 0.0–0.2)

## 2019-09-19 LAB — COMPREHENSIVE METABOLIC PANEL
ALT: 26 U/L (ref 0–44)
AST: 22 U/L (ref 15–41)
Albumin: 3.3 g/dL — ABNORMAL LOW (ref 3.5–5.0)
Alkaline Phosphatase: 46 U/L (ref 38–126)
Anion gap: 9 (ref 5–15)
BUN: 17 mg/dL (ref 8–23)
CO2: 23 mmol/L (ref 22–32)
Calcium: 9.1 mg/dL (ref 8.9–10.3)
Chloride: 107 mmol/L (ref 98–111)
Creatinine, Ser: 1.17 mg/dL — ABNORMAL HIGH (ref 0.44–1.00)
GFR calc Af Amer: 55 mL/min — ABNORMAL LOW (ref >60)
GFR calc non Af Amer: 48 mL/min — ABNORMAL LOW (ref >60)
Glucose, Bld: 243 mg/dL — ABNORMAL HIGH (ref 70–99)
Potassium: 3.9 mmol/L (ref 3.5–5.1)
Sodium: 139 mmol/L (ref 135–145)
Total Bilirubin: 0.5 mg/dL (ref 0.3–1.2)
Total Protein: 5.8 g/dL — ABNORMAL LOW (ref 6.5–8.1)

## 2019-09-19 LAB — SARS CORONAVIRUS 2 (TAT 6-24 HRS): SARS Coronavirus 2: NEGATIVE

## 2019-09-19 LAB — HIV ANTIBODY (ROUTINE TESTING W REFLEX): HIV Screen 4th Generation wRfx: NONREACTIVE

## 2019-09-19 MED ORDER — CALCIUM CARBONATE 1250 (500 CA) MG PO TABS
1.0000 | ORAL_TABLET | Freq: Every day | ORAL | Status: DC
  Administered 2019-09-20 – 2019-09-22 (×3): 500 mg via ORAL
  Filled 2019-09-19 (×3): qty 1

## 2019-09-19 MED ORDER — ADULT MULTIVITAMIN W/MINERALS CH
1.0000 | ORAL_TABLET | Freq: Every day | ORAL | Status: DC
  Administered 2019-09-20 – 2019-09-22 (×3): 1 via ORAL
  Filled 2019-09-19 (×3): qty 1

## 2019-09-19 MED ORDER — ASPIRIN EC 81 MG PO TBEC
81.0000 mg | DELAYED_RELEASE_TABLET | Freq: Every day | ORAL | Status: DC
  Administered 2019-09-20 – 2019-09-22 (×3): 81 mg via ORAL
  Filled 2019-09-19 (×3): qty 1

## 2019-09-19 MED ORDER — VITAMIN D 25 MCG (1000 UNIT) PO TABS
1000.0000 [IU] | ORAL_TABLET | Freq: Every day | ORAL | Status: DC
  Administered 2019-09-20 – 2019-09-22 (×3): 1000 [IU] via ORAL
  Filled 2019-09-19 (×3): qty 1

## 2019-09-19 MED ORDER — GABAPENTIN 300 MG PO CAPS
300.0000 mg | ORAL_CAPSULE | Freq: Three times a day (TID) | ORAL | Status: DC
  Administered 2019-09-19 – 2019-09-22 (×8): 300 mg via ORAL
  Filled 2019-09-19 (×8): qty 1

## 2019-09-19 MED ORDER — LISINOPRIL 5 MG PO TABS
2.5000 mg | ORAL_TABLET | Freq: Every day | ORAL | Status: DC
  Administered 2019-09-20 – 2019-09-21 (×2): 2.5 mg via ORAL
  Filled 2019-09-19 (×2): qty 1

## 2019-09-19 MED ORDER — MELOXICAM 7.5 MG PO TABS
7.5000 mg | ORAL_TABLET | Freq: Every day | ORAL | Status: DC
  Administered 2019-09-20 – 2019-09-22 (×3): 7.5 mg via ORAL
  Filled 2019-09-19 (×3): qty 1

## 2019-09-19 MED ORDER — HYDROCHLOROTHIAZIDE 12.5 MG PO CAPS
12.5000 mg | ORAL_CAPSULE | Freq: Every day | ORAL | Status: DC
  Administered 2019-09-20 – 2019-09-22 (×3): 12.5 mg via ORAL
  Filled 2019-09-19 (×3): qty 1

## 2019-09-19 MED ORDER — DOCUSATE CALCIUM 240 MG PO CAPS
240.0000 mg | ORAL_CAPSULE | Freq: Every day | ORAL | Status: DC
  Filled 2019-09-19 (×2): qty 1

## 2019-09-19 MED ORDER — IPRATROPIUM BROMIDE 0.06 % NA SOLN
2.0000 | Freq: Two times a day (BID) | NASAL | Status: DC
  Administered 2019-09-19 – 2019-09-22 (×4): 2 via NASAL
  Filled 2019-09-19: qty 15

## 2019-09-19 MED ORDER — MECLIZINE HCL 25 MG PO TABS
12.5000 mg | ORAL_TABLET | Freq: Three times a day (TID) | ORAL | Status: DC
  Administered 2019-09-19 – 2019-09-20 (×2): 12.5 mg via ORAL
  Filled 2019-09-19 (×2): qty 1

## 2019-09-19 MED ORDER — CHLORHEXIDINE GLUCONATE CLOTH 2 % EX PADS
6.0000 | MEDICATED_PAD | Freq: Every day | CUTANEOUS | Status: DC
  Administered 2019-09-20 – 2019-09-22 (×3): 6 via TOPICAL

## 2019-09-19 MED ORDER — PANCRELIPASE (LIP-PROT-AMYL) 12000-38000 UNITS PO CPEP
12000.0000 [IU] | ORAL_CAPSULE | Freq: Three times a day (TID) | ORAL | Status: DC
  Administered 2019-09-20 – 2019-09-22 (×8): 12000 [IU] via ORAL
  Filled 2019-09-19 (×8): qty 1

## 2019-09-19 MED ORDER — PANTOPRAZOLE SODIUM 40 MG PO TBEC
40.0000 mg | DELAYED_RELEASE_TABLET | Freq: Every day | ORAL | Status: DC
  Administered 2019-09-20 – 2019-09-22 (×3): 40 mg via ORAL
  Filled 2019-09-19 (×3): qty 1

## 2019-09-19 MED ORDER — OXYBUTYNIN CHLORIDE ER 5 MG PO TB24
5.0000 mg | ORAL_TABLET | Freq: Every day | ORAL | Status: DC
  Administered 2019-09-19 – 2019-09-21 (×3): 5 mg via ORAL
  Filled 2019-09-19 (×4): qty 1

## 2019-09-19 MED ORDER — SODIUM CHLORIDE 0.9% FLUSH
10.0000 mL | Freq: Two times a day (BID) | INTRAVENOUS | Status: DC
  Administered 2019-09-19: 40 mL
  Administered 2019-09-20 – 2019-09-21 (×3): 10 mL

## 2019-09-19 MED ORDER — POLYVINYL ALCOHOL 1.4 % OP SOLN
2.0000 [drp] | Freq: Two times a day (BID) | OPHTHALMIC | Status: DC | PRN
  Filled 2019-09-19: qty 15

## 2019-09-19 MED ORDER — ACETAMINOPHEN 500 MG PO TABS: 500.0000 mg | ORAL_TABLET | Freq: Four times a day (QID) | ORAL | Status: DC | PRN

## 2019-09-19 MED ORDER — HEPARIN SODIUM (PORCINE) 5000 UNIT/ML IJ SOLN
5000.0000 [IU] | Freq: Three times a day (TID) | INTRAMUSCULAR | Status: DC
  Administered 2019-09-19 – 2019-09-22 (×8): 5000 [IU] via SUBCUTANEOUS
  Filled 2019-09-19 (×8): qty 1

## 2019-09-19 MED ORDER — FERROUS SULFATE 300 (60 FE) MG/5ML PO SYRP
90.0000 mg | ORAL_SOLUTION | Freq: Every morning | ORAL | Status: DC
  Administered 2019-09-20 – 2019-09-22 (×3): 90 mg via ORAL
  Filled 2019-09-19 (×3): qty 5

## 2019-09-19 MED ORDER — SODIUM CHLORIDE 0.9 % IV SOLN: INTRAVENOUS | Status: DC

## 2019-09-19 MED ORDER — METOPROLOL TARTRATE 12.5 MG HALF TABLET
12.5000 mg | ORAL_TABLET | Freq: Two times a day (BID) | ORAL | Status: DC
  Administered 2019-09-20 – 2019-09-22 (×5): 12.5 mg via ORAL
  Filled 2019-09-19 (×6): qty 1

## 2019-09-19 MED ORDER — SODIUM CHLORIDE 0.9% FLUSH: 10.0000 mL | INTRAVENOUS | Status: DC | PRN

## 2019-09-19 MED ORDER — AMLODIPINE BESYLATE 5 MG PO TABS
5.0000 mg | ORAL_TABLET | Freq: Every day | ORAL | Status: DC
  Administered 2019-09-20 – 2019-09-22 (×3): 5 mg via ORAL
  Filled 2019-09-19 (×3): qty 1

## 2019-09-19 MED ORDER — CYCLOSPORINE 0.05 % OP EMUL
1.0000 [drp] | Freq: Every day | OPHTHALMIC | Status: DC
  Administered 2019-09-20 – 2019-09-22 (×3): 1 [drp] via OPHTHALMIC
  Filled 2019-09-19 (×3): qty 1

## 2019-09-19 NOTE — Progress Notes (Signed)
IV team in to access patient's porta cath.

## 2019-09-19 NOTE — Progress Notes (Signed)
MD paged for new orders patient is a direct admit from MD office Dr.Kadakia. CCMD verified tele. Consult pending for IV restart. Patient awake and alert without c/o's. Vs stable.

## 2019-09-19 NOTE — Progress Notes (Signed)
Dr.Kadaki called back reported would be over to see patient shortly and give writer new orders.

## 2019-09-19 NOTE — Progress Notes (Signed)
Paged MD for orders paged PA,and pm on call MD for orders.

## 2019-09-19 NOTE — Progress Notes (Signed)
Dr.Kadaki office nurse message me back to follow up for orders await their input.

## 2019-09-19 NOTE — Plan of Care (Signed)

## 2019-09-19 NOTE — Plan of Care (Signed)

## 2019-09-19 NOTE — H&P (Signed)
Referring Physician: Evangelyn Frantzen is an 69 y.o. female.                       Chief Complaint: Dizziness/Near syncope  HPI: 69 years old black female with PMH of breast and pancreatic cancer, S/P surgeries for cancer, hypertension, GERD, protein caloric malnutrition has dizziness worsening over 1 week without palpitations, fever, chills, earaches or GI bleed. She also has sinus congestion off and on and had increased need of medications for hypertension control last month.  Past Medical History:  Diagnosis Date  . Bilateral leg edema 11/2015  . Cancer [facility])    breast & pancreatic  . GERD (gastroesophageal reflux disease)   . Hypertension   . Trigeminal neuralgia       Past Surgical History:  Procedure Laterality Date  . ABDOMINAL HYSTERECTOMY    . BREAST LUMPECTOMY    . CARDIAC CATHETERIZATION N/A 12/31/2015   Procedure: Left Heart Cath and Coronary Angiography;  Surgeon: Dixie Dials, MD;  Location: [facility];  Service: Cardiovascular;  Laterality: N/A;  . CHOLECYSTECTOMY    . FACIAL NERVE SURGERY      No family history on file. Social History:  reports that she has never smoked. She has never used smokeless tobacco. She reports that she does not drink alcohol or use drugs.  Allergies: No Known Allergies  Medications Prior to Admission  Medication Sig Dispense Refill  . acetaminophen (TYLENOL) 500 MG tablet Take 500 mg by mouth every 6 (six) hours as needed. pain    . amLODipine (NORVASC) 5 MG tablet Take 5 mg by mouth daily.    Marland Kitchen aspirin EC 81 MG tablet Take 81 mg by mouth daily.    . calcitonin, salmon, (MIACALCIN) 200 UNIT/ACT nasal spray Place 1 spray into alternate nostrils daily as needed (allergies).    . calcium carbonate (OS-CAL - DOSED IN MG OF ELEMENTAL CALCIUM) 1250 (500 Ca) MG tablet Take 1 tablet by mouth daily.    . Cholecalciferol (VITAMIN D-3) 25 MCG (1000 UT) CAPS Take 1 capsule by mouth daily.    . cycloSPORINE (RESTASIS) 0.05 % ophthalmic  emulsion Place 1 drop into both eyes daily.    Marland Kitchen docusate calcium (SURFAK) 240 MG capsule Take 240 mg by mouth daily.    . Ferrous Sulfate 90 (18 Fe) MG TABS Take 90 mg by mouth every morning.    . fluocinonide cream (LIDEX) [id number] % Apply 1 application topically daily as needed (For rash).     . fluticasone (FLONASE) 50 MCG/ACT nasal spray Place 1 spray into the nose daily as needed. allergies    . furosemide (LASIX) 40 MG tablet Take 40 mg by mouth daily.     Marland Kitchen gabapentin (NEURONTIN) 300 MG capsule Take 300 mg by mouth 3 (three) times daily.    . Glycerin-Hypromellose-PEG 400 0.2-0.2-1 % SOLN Place 2 drops into the right eye 2 (two) times daily as needed. Dry eyes     . hydrochlorothiazide (MICROZIDE) 12.5 MG capsule Take 12.5 mg by mouth daily.    Marland Kitchen ipratropium (ATROVENT) 0.06 % nasal spray Place 2 sprays into both nostrils in the morning and at bedtime.     . lidocaine-prilocaine (EMLA) cream Apply 1 application topically daily as needed (For rash).     . lipase/protease/amylase (CREON) 12000 units CPEP capsule Take 12,000 Units by mouth 3 (three) times daily before meals.    Marland Kitchen lisinopril (PRINIVIL,ZESTRIL) 2.5 MG tablet Take 1  tablet (2.5 mg total) by mouth daily. 30 tablet 3  . meclizine (ANTIVERT) 12.5 MG tablet Take 12.5 mg by mouth daily.     . meloxicam (MOBIC) 7.5 MG tablet Take 7.5 mg by mouth daily.    . metoprolol tartrate (LOPRESSOR) 25 MG tablet Take 0.5 tablets (12.5 mg total) by mouth 2 (two) times daily. 30 tablet 3  . Multiple Vitamin (MULTI-VITAMINS) TABS Take 1 tablet by mouth daily.    Marland Kitchen omeprazole (PRILOSEC) 40 MG capsule Take 40 mg by mouth in the morning and at bedtime.     . solifenacin (VESICARE) 10 MG tablet Take 10 mg by mouth daily.      No results found for this or any previous visit (from the past 48 hour(s)). No results found.  Review Of Systems Constitutional: No fever, chills, Positive chronic weight loss. Eyes: No vision change, wears glasses. No discharge  or pain. Ears: No hearing loss, No tinnitus. Respiratory: No asthma, COPD, pneumonias. Positive shortness of breath. No hemoptysis. Cardiovascular: No chest pain, palpitation, leg edema. Gastrointestinal: No nausea, vomiting, diarrhea, constipation. No GI bleed. No hepatitis. Genitourinary: No dysuria, hematuria, kidney stone. No incontinance. Neurological: Positive headache, dizziness, no stroke, seizures.  Psychiatry: No psych facility admission for anxiety, depression, suicide. No detox. Skin: No rash. Musculoskeletal: Positive joint pain, no fibromyalgia. No neck pain, back pain. Lymphadenopathy: No lymphadenopathy. Hematology: No anemia or easy bruising.   Blood pressure (!) 150/85, pulse 62, temperature 98.1 F (36.7 C), temperature source Oral, resp. rate 18, height 5\' 7"  (1.702 m), weight 60.6 kg, SpO2 100 %. Body mass index is 20.94 kg/m. General appearance: alert, cooperative, appears stated age and no distress Head: Normocephalic, atraumatic. Eyes: Brown eyes, pink conjunctiva, corneas clear. PERRL, EOM's intact. Neck: No adenopathy, no carotid bruit, no JVD, supple, symmetrical, trachea midline and thyroid not enlarged. Resp: Clear to auscultation bilaterally. Cardio: Regular rate and rhythm, S1, S2 normal, II/VI systolic murmur, no click, rub or gallop GI: Soft, non-tender; bowel sounds normal; no organomegaly. Extremities: No edema, cyanosis or clubbing. Skin: Warm and dry.  Neurologic: Alert and oriented X 3, normal strength. Normal coordination and slow gait. Positive Romberg sign and positional vertigo.  Assessment/Plan Dizziness R/O Dysrhythmia HTN Moderate protein calorie malnutrition S/P breast surgery for breast cancer S/P Pancreatic surgery cancer  Place in observation. Resume meclizine. CT head without contrast for acute bleed or sinus inflammation Home medications and blood work, EKG, echocardiogram for LV function  Time spent: Review of old records,  Lab, x-rays, EKG, other cardiac tests, examination, discussion with patient over 70 minutes.  Birdie Riddle, MD  09/19/2019, 6:34 PM

## 2019-09-20 ENCOUNTER — Observation Stay (HOSPITAL_COMMUNITY): Payer: BC Managed Care – PPO

## 2019-09-20 DIAGNOSIS — Z6820 Body mass index (BMI) 20.0-20.9, adult: Secondary | ICD-10-CM | POA: Diagnosis not present

## 2019-09-20 DIAGNOSIS — R55 Syncope and collapse: Secondary | ICD-10-CM | POA: Diagnosis present

## 2019-09-20 DIAGNOSIS — G500 Trigeminal neuralgia: Secondary | ICD-10-CM | POA: Diagnosis present

## 2019-09-20 DIAGNOSIS — E1122 Type 2 diabetes mellitus with diabetic chronic kidney disease: Secondary | ICD-10-CM | POA: Diagnosis present

## 2019-09-20 DIAGNOSIS — N182 Chronic kidney disease, stage 2 (mild): Secondary | ICD-10-CM | POA: Diagnosis present

## 2019-09-20 DIAGNOSIS — Z853 Personal history of malignant neoplasm of breast: Secondary | ICD-10-CM | POA: Diagnosis not present

## 2019-09-20 DIAGNOSIS — Z791 Long term (current) use of non-steroidal anti-inflammatories (NSAID): Secondary | ICD-10-CM | POA: Diagnosis not present

## 2019-09-20 DIAGNOSIS — H814 Vertigo of central origin: Secondary | ICD-10-CM | POA: Diagnosis present

## 2019-09-20 DIAGNOSIS — Z7982 Long term (current) use of aspirin: Secondary | ICD-10-CM | POA: Diagnosis not present

## 2019-09-20 DIAGNOSIS — Z90710 Acquired absence of both cervix and uterus: Secondary | ICD-10-CM | POA: Diagnosis not present

## 2019-09-20 DIAGNOSIS — K219 Gastro-esophageal reflux disease without esophagitis: Secondary | ICD-10-CM | POA: Diagnosis present

## 2019-09-20 DIAGNOSIS — Z79899 Other long term (current) drug therapy: Secondary | ICD-10-CM | POA: Diagnosis not present

## 2019-09-20 DIAGNOSIS — Z8507 Personal history of malignant neoplasm of pancreas: Secondary | ICD-10-CM | POA: Diagnosis not present

## 2019-09-20 DIAGNOSIS — D631 Anemia in chronic kidney disease: Secondary | ICD-10-CM | POA: Diagnosis present

## 2019-09-20 DIAGNOSIS — Z20822 Contact with and (suspected) exposure to covid-19: Secondary | ICD-10-CM | POA: Diagnosis present

## 2019-09-20 DIAGNOSIS — E440 Moderate protein-calorie malnutrition: Secondary | ICD-10-CM | POA: Diagnosis present

## 2019-09-20 DIAGNOSIS — I129 Hypertensive chronic kidney disease with stage 1 through stage 4 chronic kidney disease, or unspecified chronic kidney disease: Secondary | ICD-10-CM | POA: Diagnosis present

## 2019-09-20 DIAGNOSIS — G9389 Other specified disorders of brain: Secondary | ICD-10-CM | POA: Diagnosis present

## 2019-09-20 LAB — CBC
HCT: 34.4 % — ABNORMAL LOW (ref 36.0–46.0)
Hemoglobin: 10.7 g/dL — ABNORMAL LOW (ref 12.0–15.0)
MCH: 28.8 pg (ref 26.0–34.0)
MCHC: 31.1 g/dL (ref 30.0–36.0)
MCV: 92.7 fL (ref 80.0–100.0)
Platelets: 223 10*3/uL (ref 150–400)
RBC: 3.71 MIL/uL — ABNORMAL LOW (ref 3.87–5.11)
RDW: 13.2 % (ref 11.5–15.5)
WBC: 3.4 10*3/uL — ABNORMAL LOW (ref 4.0–10.5)
nRBC: 0 % (ref 0.0–0.2)

## 2019-09-20 LAB — IRON AND TIBC
Iron: 75 ug/dL (ref 28–170)
Saturation Ratios: 34 % — ABNORMAL HIGH (ref 10.4–31.8)
TIBC: 221 ug/dL — ABNORMAL LOW (ref 250–450)
UIBC: 146 ug/dL

## 2019-09-20 LAB — BASIC METABOLIC PANEL
Anion gap: 6 (ref 5–15)
BUN: 19 mg/dL (ref 8–23)
CO2: 27 mmol/L (ref 22–32)
Calcium: 9 mg/dL (ref 8.9–10.3)
Chloride: 110 mmol/L (ref 98–111)
Creatinine, Ser: 1.23 mg/dL — ABNORMAL HIGH (ref 0.44–1.00)
GFR calc Af Amer: 52 mL/min — ABNORMAL LOW (ref >60)
GFR calc non Af Amer: 45 mL/min — ABNORMAL LOW (ref >60)
Glucose, Bld: 92 mg/dL (ref 70–99)
Potassium: 3.5 mmol/L (ref 3.5–5.1)
Sodium: 143 mmol/L (ref 135–145)

## 2019-09-20 LAB — PROTIME-INR
INR: 1 (ref 0.8–1.2)
Prothrombin Time: 13.4 seconds (ref 11.4–15.2)

## 2019-09-20 LAB — ECHOCARDIOGRAM COMPLETE
Height: 67 in
Weight: 2144 oz

## 2019-09-20 LAB — HEMOGLOBIN A1C
Hgb A1c MFr Bld: 6.3 % — ABNORMAL HIGH (ref 4.8–5.6)
Mean Plasma Glucose: 134.11 mg/dL

## 2019-09-20 MED ORDER — MECLIZINE HCL 25 MG PO TABS
25.0000 mg | ORAL_TABLET | Freq: Three times a day (TID) | ORAL | Status: DC
  Administered 2019-09-20 – 2019-09-21 (×3): 25 mg via ORAL
  Filled 2019-09-20 (×3): qty 1

## 2019-09-20 MED ORDER — DOCUSATE SODIUM 100 MG PO CAPS
100.0000 mg | ORAL_CAPSULE | Freq: Every day | ORAL | Status: DC
  Administered 2019-09-20 – 2019-09-22 (×3): 100 mg via ORAL
  Filled 2019-09-20 (×3): qty 1

## 2019-09-20 NOTE — Progress Notes (Signed)
Ref: Dixie Dials, MD   Subjective:  Feeling dizzy not able to ambulate as before. VS stable. Monitor: Sinus rhythm. Echocardiogram with Good LV systolic function, Moderate MR and mild to moderate TR. CT brain is suggestive for right cerebellar decreased circulation.   Objective:  Vital Signs in the last 24 hours: Temp:  [97.9 F (36.6 C)-98.6 F (37 C)] 98.4 F (36.9 C) (02/23 1159) Pulse Rate:  [53-78] 58 (02/23 1159) Cardiac Rhythm: Normal sinus rhythm (02/23 0705) Resp:  [17-18] 17 (02/23 1159) BP: (121-150)/(63-85) 127/63 (02/23 1159) SpO2:  [96 %-100 %] 96 % (02/23 1159) Weight:  [60.6 kg-60.8 kg] 60.8 kg (02/23 0454)  Physical Exam: BP Readings from Last 1 Encounters:  09/20/19 127/63     Wt Readings from Last 1 Encounters:  09/20/19 60.8 kg    Weight change:  Body mass index is 20.99 kg/m. HEENT: /AT, Eyes-Brown, PERL, EOMI, Conjunctiva-Pink, Sclera-Non-icteric Neck: No JVD, No bruit, Trachea midline. Lungs:  Clear, Bilateral. Cardiac:  Regular rhythm, normal S1 and S2, no S3. III/VI systolic murmur. Abdomen:  Soft, non-tender. BS present. Extremities:  No edema present. No cyanosis. No clubbing. CNS: AxOx3, Cranial nerves grossly intact, moves all 4 extremities.  Skin: Warm and dry.   Intake/Output from previous day: 02/22 0701 - 02/23 0700 In: 826 [P.O.:660; I.V.:166] Out: 1200 [Urine:1200]    Lab Results: BMET    Component Value Date/Time   NA 143 09/20/2019 0540   NA 139 09/19/2019 1839   NA 143 02/26/2017 0412   K 3.5 09/20/2019 0540   K 3.9 09/19/2019 1839   K 3.5 02/26/2017 0412   CL 110 09/20/2019 0540   CL 107 09/19/2019 1839   CL 108 02/26/2017 0412   CO2 27 09/20/2019 0540   CO2 23 09/19/2019 1839   CO2 29 02/26/2017 0412   GLUCOSE 92 09/20/2019 0540   GLUCOSE 243 (H) 09/19/2019 1839   GLUCOSE 88 02/26/2017 0412   BUN 19 09/20/2019 0540   BUN 17 09/19/2019 1839   BUN 16 02/26/2017 0412   CREATININE 1.23 (H) 09/20/2019 0540   CREATININE 1.17 (H) 09/19/2019 1839   CREATININE 0.87 02/26/2017 0412   CALCIUM 9.0 09/20/2019 0540   CALCIUM 9.1 09/19/2019 1839   CALCIUM 8.5 (L) 02/26/2017 0412   GFRNONAA 45 (L) 09/20/2019 0540   GFRNONAA 48 (L) 09/19/2019 1839   GFRNONAA >60 02/26/2017 0412   GFRAA 52 (L) 09/20/2019 0540   GFRAA 55 (L) 09/19/2019 1839   GFRAA >60 02/26/2017 0412   CBC    Component Value Date/Time   WBC 3.4 (L) 09/20/2019 0540   RBC 3.71 (L) 09/20/2019 0540   HGB 10.7 (L) 09/20/2019 0540   HGB 12.0 08/31/2007 1136   HCT 34.4 (L) 09/20/2019 0540   HCT 35.8 08/31/2007 1136   PLT 223 09/20/2019 0540   PLT 247 08/31/2007 1136   MCV 92.7 09/20/2019 0540   MCV 83.4 08/31/2007 1136   MCH 28.8 09/20/2019 0540   MCHC 31.1 09/20/2019 0540   RDW 13.2 09/20/2019 0540   RDW 14.9 (H) 08/31/2007 1136   LYMPHSABS 0.9 09/19/2019 1839   LYMPHSABS 1.2 08/31/2007 1136   MONOABS 0.4 09/19/2019 1839   MONOABS 0.5 08/31/2007 1136   EOSABS 0.2 09/19/2019 1839   EOSABS 0.2 08/31/2007 1136   BASOSABS 0.0 09/19/2019 1839   BASOSABS 0.0 08/31/2007 1136   HEPATIC Function Panel Recent Labs    09/19/19 1839  PROT 5.8*   HEMOGLOBIN A1C No components found for: HGA1C,  MPG CARDIAC ENZYMES Lab Results  Component Value Date   TROPONINI <0.03 02/26/2017   TROPONINI <0.03 02/25/2017   TROPONINI <0.03 02/25/2017   BNP No results for input(s): PROBNP in the last 8760 hours. TSH No results for input(s): TSH in the last 8760 hours. CHOLESTEROL No results for input(s): CHOL in the last 8760 hours.  Scheduled Meds: . amLODipine  5 mg Oral Daily  . aspirin EC  81 mg Oral Daily  . calcium carbonate  1 tablet Oral Q lunch  . Chlorhexidine Gluconate Cloth  6 each Topical Daily  . cholecalciferol  1,000 Units Oral Daily  . cycloSPORINE  1 drop Both Eyes Daily  . docusate sodium  100 mg Oral Daily  . ferrous sulfate  90 mg Oral q morning - 10a  . gabapentin  300 mg Oral TID  . heparin  5,000 Units  Subcutaneous Q8H  . hydrochlorothiazide  12.5 mg Oral Daily  . ipratropium  2 spray Each Nare BID  . lipase/protease/amylase  12,000 Units Oral TID AC  . lisinopril  2.5 mg Oral Daily  . meclizine  25 mg Oral TID  . meloxicam  7.5 mg Oral Daily  . metoprolol tartrate  12.5 mg Oral BID  . multivitamin with minerals  1 tablet Oral Daily  . oxybutynin  5 mg Oral QHS  . pantoprazole  40 mg Oral Daily  . sodium chloride flush  10-40 mL Intracatheter Q12H   Continuous Infusions: . 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 3 times daily. Increase activity.    LOS: 0 days   Time spent including chart review, lab review, examination, discussion with patient : 30 min   Dixie Dials  MD  09/20/2019, 1:08 PM

## 2019-09-20 NOTE — Evaluation (Signed)
Physical Therapy Evaluation Patient Details Name: Rebekah Steele MRN: [id number] DOB: Dec 01, 1950 Today's Date: 09/20/2019   History of Present Illness  Pt is a 69 y/o female admitted secondary to worsening dizziness. Possibly secondary to positional vertigo of central origin per MD notes. PMH includes vertigo, breast cancer s/p surgery, pancreatic cancer s/p surgery and HTN.   Clinical Impression  Pt admitted secondary to problem above with deficits below. Pt reporting mild dizziness during session, however, reports this is baseline. Required min guard to min A for mobility tasks using RW. Did note nystagmus at L lateral gaze end range. Pt reports she does not feel like she needs PT follow up. Will continue to follow acutely to maximize functional mobility independence and safety.     Follow Up Recommendations No PT follow up;Supervision for mobility/OOB(pt reports she feels she does not need follow up PT )    Equipment Recommendations  None recommended by PT    Recommendations for Other Services       Precautions / Restrictions Precautions Precautions: Fall Restrictions Weight Bearing Restrictions: No      Mobility  Bed Mobility Overal bed mobility: Needs Assistance Bed Mobility: Supine to Sit;Sit to Supine     Supine to sit: Min guard Sit to supine: Min guard   General bed mobility comments: Min guard for safety. Reports increased dizziness that subsided with seated rest.   Transfers Overall transfer level: Needs assistance Equipment used: Rolling walker (2 wheeled) Transfers: Sit to/from Stand Sit to Stand: Min assist         General transfer comment: Min A for steadying assist. Some dizziness reported, but reports that is her baseline.   Ambulation/Gait Ambulation/Gait assistance: Min guard Gait Distance (Feet): 20 Feet Assistive device: Rolling walker (2 wheeled) Gait Pattern/deviations: Step-through pattern;Decreased stride length Gait velocity: Decreased    General Gait Details: Slow, cautious gait. Overall steady with use of RW. Reports some dizziness, however, reports that is baseline. Pt requesting to stay in the room this session.   Stairs            Wheelchair Mobility    Modified Rankin (Stroke Patients Only)       Balance Overall balance assessment: Needs assistance Sitting-balance support: No upper extremity supported;Feet supported Sitting balance-Leahy Scale: Good     Standing balance support: Bilateral upper extremity supported;During functional activity Standing balance-Leahy Scale: Poor Standing balance comment: Reliant on BUE support                              Pertinent Vitals/Pain Pain Assessment: No/denies pain    Home Living Family/patient expects to be discharged to:: Private residence Living Arrangements: Spouse/significant other Available Help at Discharge: Family;Available 24 hours/day Type of Home: House Home Access: Stairs to enter Entrance Stairs-Rails: Left Entrance Stairs-Number of Steps: 4 Home Layout: Two level;Able to live on main level with bedroom/bathroom Home Equipment: Walker - 4 wheels;Walker - 2 wheels      Prior Function Level of Independence: Independent with assistive device(s)         Comments: Reports using rollator outside of home and RW inside of home.      Hand Dominance        Extremity/Trunk Assessment   Upper Extremity Assessment Upper Extremity Assessment: Overall WFL for tasks assessed    Lower Extremity Assessment Lower Extremity Assessment: Generalized weakness    Cervical / Trunk Assessment Cervical / Trunk Assessment: Kyphotic  Communication  Communication: No difficulties  Cognition Arousal/Alertness: Awake/alert Behavior During Therapy: WFL for tasks assessed/performed Overall Cognitive Status: Within Functional Limits for tasks assessed                                        General Comments General  comments (skin integrity, edema, etc.): Noted nystagmus with L lateral gaze. Will likely need more formal assessment     Exercises     Assessment/Plan    PT Assessment Patient needs continued PT services  PT Problem List Decreased balance;Decreased activity tolerance;Decreased mobility       PT Treatment Interventions Gait training;Functional mobility training;Stair training;DME instruction;Therapeutic activities;Balance training;Therapeutic exercise;Patient/family education    PT Goals (Current goals can be found in the Care Plan section)  Acute Rehab PT Goals Patient Stated Goal: to go home PT Goal Formulation: With patient Time For Goal Achievement: 10/04/19 Potential to Achieve Goals: Good    Frequency Min 3X/week   Barriers to discharge        Co-evaluation               AM-PAC PT "6 Clicks" Mobility  Outcome Measure Help needed turning from your back to your side while in a flat bed without using bedrails?: None Help needed moving from lying on your back to sitting on the side of a flat bed without using bedrails?: A Little Help needed moving to and from a bed to a chair (including a wheelchair)?: A Little Help needed standing up from a chair using your arms (e.g., wheelchair or bedside chair)?: A Little Help needed to walk in hospital room?: A Little Help needed climbing 3-5 steps with a railing? : A Lot 6 Click Score: 18    End of Session Equipment Utilized During Treatment: Gait belt Activity Tolerance: Patient tolerated treatment well Patient left: in bed;with call bell/phone within reach;with bed alarm set Nurse Communication: Mobility status PT Visit Diagnosis: Unsteadiness on feet (R26.81);Dizziness and giddiness (R42)    Time: [id number] PT Time Calculation (min) (ACUTE ONLY): 23 min   Charges:   PT Evaluation $PT Eval Moderate Complexity: 1 Mod PT Treatments $Gait Training: 8-22 mins        Lou Miner, DPT  Acute Rehabilitation  Services  Pager: [phone] Office: [phone]   Rudean Hitt 09/20/2019, 3:54 PM

## 2019-09-20 NOTE — Plan of Care (Signed)
  Problem: Education: Goal: Knowledge of General Education information will improve Description: Including pain rating scale, medication(s)/side effects and non-pharmacologic comfort measures Outcome: Progressing   Problem: Health Behavior/Discharge Planning: Goal: Ability to manage health-related needs will improve Outcome: Progressing   Problem: Clinical Measurements: Goal: Ability to maintain clinical measurements within normal limits will improve Outcome: Progressing Goal: Will remain free from infection Outcome: Progressing Goal: Diagnostic test results will improve Outcome: Progressing Goal: Respiratory complications will improve Outcome: Progressing Goal: Cardiovascular complication will be avoided Outcome: Progressing   Problem: Activity: Goal: Risk for activity intolerance will decrease Outcome: Progressing   Problem: Elimination: Goal: Will not experience complications related to bowel motility Outcome: Progressing Goal: Will not experience complications related to urinary retention Outcome: Progressing   Problem: Pain Managment: Goal: General experience of comfort will improve Outcome: Progressing   Problem: Safety: Goal: Ability to remain free from injury will improve Outcome: Progressing   Problem: Skin Integrity: Goal: Risk for impaired skin integrity will decrease Outcome: Progressing   Problem: Education: Goal: Knowledge of condition and prescribed therapy will improve Outcome: Progressing   Problem: Cardiac: Goal: Will achieve and/or maintain adequate cardiac output Outcome: Progressing   Problem: Physical Regulation: Goal: Complications related to the disease process, condition or treatment will be avoided or minimized Outcome: Progressing

## 2019-09-20 NOTE — Progress Notes (Signed)
  Echocardiogram 2D Echocardiogram has been performed.  Rebekah Steele 09/20/2019, 10:25 AM

## 2019-09-21 ENCOUNTER — Encounter (HOSPITAL_COMMUNITY): Payer: Self-pay | Admitting: Cardiovascular Disease

## 2019-09-21 ENCOUNTER — Other Ambulatory Visit: Payer: Self-pay

## 2019-09-21 MED ORDER — POTASSIUM CHLORIDE ER 10 MEQ PO TBCR
10.0000 meq | EXTENDED_RELEASE_TABLET | Freq: Every day | ORAL | Status: DC
  Administered 2019-09-21 – 2019-09-22 (×2): 10 meq via ORAL
  Filled 2019-09-21 (×4): qty 1

## 2019-09-21 MED ORDER — MECLIZINE HCL 25 MG PO TABS
12.5000 mg | ORAL_TABLET | Freq: Three times a day (TID) | ORAL | Status: DC
  Administered 2019-09-21 – 2019-09-22 (×3): 12.5 mg via ORAL
  Filled 2019-09-21 (×3): qty 1

## 2019-09-21 NOTE — Progress Notes (Addendum)
Ref: Dixie Dials, MD   Subjective:  Very sleepy this AM. Low normal potassium level. Afebrile. Sinus rhythm on monitor.  Objective:  Vital Signs in the last 24 hours: Temp:  [97.6 F (36.4 C)-98.4 F (36.9 C)] 97.6 F (36.4 C) (02/24 1606) Pulse Rate:  [48-98] 98 (02/24 1606) Cardiac Rhythm: Sinus bradycardia (02/24 0700) Resp:  [16-18] 16 (02/24 1214) BP: (110-128)/(54-77) 110/54 (02/24 1606) SpO2:  [97 %-100 %] 98 % (02/24 1606) Weight:  [59.7 kg] 59.7 kg (02/24 0436)  Physical Exam: BP Readings from Last 1 Encounters:  09/21/19 (!) 110/54     Wt Readings from Last 1 Encounters:  09/21/19 59.7 kg    Weight change: -0.907 kg Body mass index is 20.63 kg/m. HEENT: [place]/AT, Eyes-Brown, PERL, EOMI, Conjunctiva-Pink, Sclera-Non-icteric Neck: No JVD, No bruit, Trachea midline. Lungs:  Clear, Bilateral. Cardiac:  Regular rhythm, normal S1 and S2, no S3. II/VI systolic murmur. Abdomen:  Soft, non-tender. BS present. Extremities:  No edema present. No cyanosis. No clubbing. CNS: AxOx3, Cranial nerves grossly intact, moves all 4 extremities.  Skin: Warm and dry.   Intake/Output from previous day: 02/23 0701 - 02/24 0700 In: 2052 [P.O.:960; I.V.:1092] Out: 1600 [Urine:1600]    Lab Results: BMET    Component Value Date/Time   NA 143 09/20/2019 0540   NA 139 09/19/2019 1839   NA 143 02/26/2017 0412   K 3.5 09/20/2019 0540   K 3.9 09/19/2019 1839   K 3.5 02/26/2017 0412   CL 110 09/20/2019 0540   CL 107 09/19/2019 1839   CL 108 02/26/2017 0412   CO2 27 09/20/2019 0540   CO2 23 09/19/2019 1839   CO2 29 02/26/2017 0412   GLUCOSE 92 09/20/2019 0540   GLUCOSE 243 (H) 09/19/2019 1839   GLUCOSE 88 02/26/2017 0412   BUN 19 09/20/2019 0540   BUN 17 09/19/2019 1839   BUN 16 02/26/2017 0412   CREATININE 1.23 (H) 09/20/2019 0540   CREATININE 1.17 (H) 09/19/2019 1839   CREATININE 0.87 02/26/2017 0412   CALCIUM 9.0 09/20/2019 0540   CALCIUM 9.1 09/19/2019 1839   CALCIUM  8.5 (L) 02/26/2017 0412   GFRNONAA 45 (L) 09/20/2019 0540   GFRNONAA 48 (L) 09/19/2019 1839   GFRNONAA >60 02/26/2017 0412   GFRAA 52 (L) 09/20/2019 0540   GFRAA 55 (L) 09/19/2019 1839   GFRAA >60 02/26/2017 0412   CBC    Component Value Date/Time   WBC 3.4 (L) 09/20/2019 0540   RBC 3.71 (L) 09/20/2019 0540   HGB 10.7 (L) 09/20/2019 0540   HGB 12.0 08/31/2007 1136   HCT 34.4 (L) 09/20/2019 0540   HCT 35.8 08/31/2007 1136   PLT 223 09/20/2019 0540   PLT 247 08/31/2007 1136   MCV 92.7 09/20/2019 0540   MCV 83.4 08/31/2007 1136   MCH 28.8 09/20/2019 0540   MCHC 31.1 09/20/2019 0540   RDW 13.2 09/20/2019 0540   RDW 14.9 (H) 08/31/2007 1136   LYMPHSABS 0.9 09/19/2019 1839   LYMPHSABS 1.2 08/31/2007 1136   MONOABS 0.4 09/19/2019 1839   MONOABS 0.5 08/31/2007 1136   EOSABS 0.2 09/19/2019 1839   EOSABS 0.2 08/31/2007 1136   BASOSABS 0.0 09/19/2019 1839   BASOSABS 0.0 08/31/2007 1136   HEPATIC Function Panel Recent Labs    09/19/19 1839  PROT 5.8*   HEMOGLOBIN A1C No components found for: HGA1C,  MPG CARDIAC ENZYMES Lab Results  Component Value Date   TROPONINI <0.03 02/26/2017   TROPONINI <0.03 02/25/2017   TROPONINI <  0.03 02/25/2017   BNP No results for input(s): PROBNP in the last 8760 hours. TSH No results for input(s): TSH in the last 8760 hours. CHOLESTEROL No results for input(s): CHOL in the last 8760 hours.  Scheduled Meds: . amLODipine  5 mg Oral Daily  . aspirin EC  81 mg Oral Daily  . calcium carbonate  1 tablet Oral Q lunch  . Chlorhexidine Gluconate Cloth  6 each Topical Daily  . cholecalciferol  1,000 Units Oral Daily  . cycloSPORINE  1 drop Both Eyes Daily  . docusate sodium  100 mg Oral Daily  . ferrous sulfate  90 mg Oral q morning - 10a  . gabapentin  300 mg Oral TID  . heparin  5,000 Units Subcutaneous Q8H  . hydrochlorothiazide  12.5 mg Oral Daily  . ipratropium  2 spray Each Nare BID  . lipase/protease/amylase  12,000 Units Oral TID AC   . lisinopril  2.5 mg Oral Daily  . meclizine  12.5 mg Oral TID  . meloxicam  7.5 mg Oral Daily  . metoprolol tartrate  12.5 mg Oral BID  . multivitamin with minerals  1 tablet Oral Daily  . oxybutynin  5 mg Oral QHS  . pantoprazole  40 mg Oral Daily  . potassium chloride  10 mEq Oral Daily  . sodium chloride flush  10-40 mL Intracatheter Q12H   Continuous Infusions: . sodium chloride 40 mL/hr at 09/20/19 2156   PRN Meds:.acetaminophen, polyvinyl alcohol, sodium chloride flush  Assessment/Plan: Near syncope Positional vertigo, central in origin HTN Moderate protein calorie malnutrition Moderate MR Mild to moderate TR S/P breast surgery for cancer S/P pancreatic surgery for cancer  Decrease meclizine dose and increase activity. Potassium supplement.   LOS: 1 day   Time spent including chart review, lab review, examination, discussion with patient and nurse : 30 min   Dixie Dials  MD  09/21/2019, 5:36 PM

## 2019-09-21 NOTE — Plan of Care (Signed)
  Problem: Health Behavior/Discharge Planning: Goal: Ability to manage health-related needs will improve Outcome: Progressing   Problem: Clinical Measurements: Goal: Ability to maintain clinical measurements within normal limits will improve Outcome: Progressing Goal: Will remain free from infection Outcome: Progressing Goal: Diagnostic test results will improve Outcome: Progressing   

## 2019-09-21 NOTE — Progress Notes (Signed)
Patient ambulated approximately 100 feet, tolerated fair. Denied shortness of breath or any pain. Patient stated she was very dizzy/lightheaded halfway through and required a chair to rest in. After resting for 5 minutes, patient was able to ambulate back to her room and tolerated fair. After ambulation, patient stated she still felt dizzy. BP was 127/95, HR was 71 and oxygen saturation was 98% while ambulating on room air.

## 2019-09-21 NOTE — Progress Notes (Signed)
To the best of my knowledge, the student's charting is accurate.  

## 2019-09-22 MED ORDER — POTASSIUM CHLORIDE ER 10 MEQ PO TBCR: 10.0000 meq | EXTENDED_RELEASE_TABLET | Freq: Every day | ORAL | 3 refills | Status: DC

## 2019-09-22 MED ORDER — HEPARIN SOD (PORK) LOCK FLUSH 100 UNIT/ML IV SOLN
500.0000 [IU] | INTRAVENOUS | Status: AC | PRN
  Administered 2019-09-22: 500 [IU]
  Filled 2019-09-22: qty 5

## 2019-09-22 MED ORDER — MECLIZINE HCL 12.5 MG PO TABS: 12.5000 mg | ORAL_TABLET | Freq: Three times a day (TID) | ORAL | 3 refills | Status: AC

## 2019-09-22 NOTE — Discharge Summary (Signed)
Physician Discharge Summary  Patient ID: Rebekah Steele MRN: [id number] DOB/AGE: 12/12/1950 69 y.o.  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 She has mild dizziness all the time for several years, has sinus congestion on and off and takes low dose meclizine on daily basis for several years.  Her blood work was near normal except mild anemia without iron deficiency and chronic leukopenia. Her creatinine was 1.17 to 1.23. Her CT head showed evidence of right occipital craniotomy with encephalomalacia in right cerebellum. Echocardiogram showed normal LV systolic function with moderate MR and mild to moderate TR. EKG was normal. Her meclizine was increased to 25 mg. tid and she became excessively sleepy hence the dose was decreased to 12.5 mg. Tid. Today Apley's maneuver was given. She was able to ambulate well with with a walker and follows instructions to avoid sudden change in position.  She was discharged home in fair condition and will see me in 1 week. She was instructed to use walker all the time until her condition improves.  Consults: cardiology  Significant Diagnostic  Studies: labs: Very mild leukopenia and anemia with creatine of 1.23 and Hgb A1C of 6.3 otherwise unremarkable.  EKG: NSR. CT brain: Mild right cerebellar encephalomalacia.   Treatments: IV hydration, cardiac meds: lisinopril (generic), metoprolol, amlodipine, HCTZ and potassium.  Discharge Exam: Blood pressure 117/79, pulse 80, temperature 98.6 F (37 C), temperature source Oral, resp. rate 18, height 5\' 7"  (1.702 m), weight 62.1 kg, SpO2 98 %. General appearance: alert, cooperative and appears stated age. Head: Normocephalic, atraumatic. Eyes: Brown eyes, pink conjunctiva, corneas clear. PERRL, EOM's intact.  Neck: No adenopathy, no carotid bruit, no JVD, supple, symmetrical, trachea midline and thyroid not enlarged. Resp: Clear to auscultation bilaterally. Cardio: Regular rate and rhythm, S1, S2 normal, II/VI systolic murmur, no click, rub or gallop. GI: Soft, non-tender; bowel sounds normal; no organomegaly. Extremities: No edema, cyanosis or clubbing. Skin: Warm and dry.  Neurologic: Alert and oriented X 3, normal strength and tone. Normal coordination and slow gait with mild right sided leaning.  Disposition: Discharge disposition: 01-Home or Self Care        Allergies as of 09/22/2019   No Known Allergies     Medication List    STOP taking these medications   furosemide 40 MG tablet Commonly known as: LASIX     TAKE these medications   amLODipine 5 MG tablet Commonly known as: NORVASC Take 5 mg by mouth daily.   aspirin EC 81 MG tablet Take 81 mg by mouth daily.   calcium carbonate 1250 (500 Ca) MG tablet Commonly known as: OS-CAL - dosed in mg of elemental calcium Take 1 tablet by mouth daily.  cycloSPORINE 0.05 % ophthalmic emulsion Commonly known as: RESTASIS Place 1 drop into both eyes daily.   docusate calcium 240 MG capsule Commonly known as: SURFAK Take 240 mg by mouth daily.   Ferrous Sulfate 90 (18 Fe) MG Tabs Take 90 mg by mouth every  morning.   fluocinonide cream 0.05 % Commonly known as: LIDEX Apply 1 application topically daily as needed (For rash).   fluticasone 50 MCG/ACT nasal spray Commonly known as: FLONASE Place 1 spray into the nose daily as needed. allergies   gabapentin 300 MG capsule Commonly known as: NEURONTIN Take 300 mg by mouth 3 (three) times daily.   Glycerin-Hypromellose-PEG 400 0.2-0.2-1 % Soln Place 2 drops into the right eye 2 (two) times daily as needed. Dry eyes   hydrochlorothiazide 12.5 MG capsule Commonly known as: MICROZIDE Take 12.5 mg by mouth daily.   ipratropium 0.06 % nasal spray Commonly known as: ATROVENT Place 2 sprays into both nostrils in the morning and at bedtime.   lidocaine-prilocaine cream Commonly known as: EMLA Apply 1 application topically daily as needed (For rash).   lipase/protease/amylase 12000 units Cpep capsule Commonly known as: CREON Take 12,000 Units by mouth 3 (three) times daily before meals.   lisinopril 2.5 MG tablet Commonly known as: ZESTRIL Take 1 tablet (2.5 mg total) by mouth daily.   meclizine 12.5 MG tablet Commonly known as: ANTIVERT Take 1 tablet (12.5 mg total) by mouth 3 (three) times daily. What changed: when to take this   meloxicam 7.5 MG tablet Commonly known as: MOBIC Take 7.5 mg by mouth daily.   metoprolol tartrate 25 MG tablet Commonly known as: LOPRESSOR Take 0.5 tablets (12.5 mg total) by mouth 2 (two) times daily.   Miacalcin 200 UNIT/ACT nasal spray Generic drug: calcitonin (salmon) Place 1 spray into alternate nostrils daily as needed (allergies).   Multi-Vitamins Tabs Take 1 tablet by mouth daily.   omeprazole 40 MG capsule Commonly known as: PRILOSEC Take 40 mg by mouth in the morning and at bedtime.   potassium chloride 10 MEQ tablet Commonly known as: KLOR-CON Take 1 tablet (10 mEq total) by mouth daily. Start taking on: September 23, 2019   solifenacin 10 MG tablet Commonly known as:  VESICARE Take 10 mg by mouth daily.   TYLENOL 500 MG tablet Generic drug: acetaminophen Take 500 mg by mouth every 6 (six) hours as needed. pain   Vitamin D-3 25 MCG (1000 UT) Caps Take 1 capsule by mouth daily.      Follow-up Information    Dixie Dials, MD. Schedule an appointment as soon as possible for a visit in 1 week(s).   Specialty: Cardiology Contact information: [place] [address] [phone]           Time spent: Review of old chart, current chart, lab, x-ray, cardiac tests and discussion with patient over 60 minutes.  Signed: Birdie Riddle 09/22/2019, 10:40 AM

## 2019-09-22 NOTE — Progress Notes (Signed)
Physical Therapy Treatment Patient Details Name: Rebekah Steele MRN: [id number] DOB: 20-Dec-1950 Today's Date: 09/22/2019    History of Present Illness Pt is a 69 y/o female admitted secondary to worsening dizziness. Possibly secondary to positional vertigo of central origin per MD notes. PMH includes vertigo, breast cancer s/p surgery, pancreatic cancer s/p surgery and HTN.     PT Comments    Pt admitted with above diagnosis. Pt was able to ambulate with RW to bathroom and back with min guard assist.  No LOB. 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 Pt denies need for HHPT stating she can follow HEP.   Pt currently with functional limitations due to balance and endurance deficits. Pt will benefit from skilled PT to increase their independence and safety with mobility to allow discharge to the venue listed below.     Follow Up Recommendations  No PT follow up;Supervision for mobility/OOB(pt reports she feels she does not need follow up PT )     Equipment Recommendations  None recommended by PT    Recommendations for Other Services       Precautions / Restrictions Precautions Precautions: Fall Restrictions Weight Bearing Restrictions: No    Mobility  Bed Mobility Overal bed mobility: Needs Assistance Bed Mobility: Supine to Sit;Sit to Supine     Supine to sit: Min guard Sit to supine: Min guard   General bed mobility comments: Min guard for safety. Reports increased dizziness that subsided with seated rest.   Transfers Overall transfer level: Needs assistance Equipment used: Rolling walker (2 wheeled) Transfers: Sit to/from Stand Sit to Stand: Min guard         General transfer comment: Min guard A and no steadying assist. Some dizziness reported, but reports that is her baseline.   Ambulation/Gait Ambulation/Gait assistance: Min guard Gait Distance (Feet): 60 Feet Assistive device: Rolling walker (2 wheeled) Gait  Pattern/deviations: Step-through pattern;Decreased stride length Gait velocity: Decreased Gait velocity interpretation: <1.31 ft/sec, indicative of household ambulator General Gait Details: Slow, cautious gait. Overall steady with use of RW. Reports some dizziness, however, reports that is baseline. Pt requesting to stay in the room this session.    Stairs             Wheelchair Mobility    Modified Rankin (Stroke Patients Only)       Balance Overall balance assessment: Needs assistance Sitting-balance support: No upper extremity supported;Feet supported Sitting balance-Leahy Scale: Good     Standing balance support: Bilateral upper extremity supported;During functional activity Standing balance-Leahy Scale: Poor Standing balance comment: Reliant on BUE support                             Cognition Arousal/Alertness: Awake/alert Behavior During Therapy: WFL for tasks assessed/performed Overall Cognitive Status: Within Functional Limits for tasks assessed                                        Exercises Other Exercises Other Exercises: x1 exercises- [place] exercise program emailed to pt. 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  Initiated x 1 exercises.       Pertinent Vitals/Pain Pain Assessment: No/denies pain    Home Living  Prior Function            PT Goals (current goals can now be found in the care plan section) Acute Rehab PT Goals Patient Stated Goal: to go home Progress towards PT goals: Progressing toward goals    Frequency    Min 3X/week      PT Plan Current plan remains appropriate    Co-evaluation              AM-PAC PT "6 Clicks" Mobility   Outcome Measure  Help needed turning from your back to your side while in a flat bed without using bedrails?: None Help needed  moving from lying on your back to sitting on the side of a flat bed without using bedrails?: A Little Help needed moving to and from a bed to a chair (including a wheelchair)?: A Little Help needed standing up from a chair using your arms (e.g., wheelchair or bedside chair)?: A Little Help needed to walk in hospital room?: A Little Help needed climbing 3-5 steps with a railing? : A Lot 6 Click Score: 18    End of Session Equipment Utilized During Treatment: Gait belt Activity Tolerance: Patient tolerated treatment well Patient left: with call bell/phone within reach;in chair;with chair alarm set Nurse Communication: Mobility status PT Visit Diagnosis: Unsteadiness on feet (R26.81);Dizziness and giddiness (R42)     Time: [id number] PT Time Calculation (min) (ACUTE ONLY): 23 min  Charges:  $Gait Training: 8-22 mins $Therapeutic Exercise: 8-22 mins                     Frida Wahlstrom W,PT Acute Rehabilitation Services Pager:  [phone]  Office:  [place] 09/22/2019, 1:31 PM

## 2019-09-22 NOTE — Progress Notes (Signed)
Discharge education and medication education given to patient with teach back. All questions and concerns answered. Porta cath was deaccessed by IV team and telemetry leads removed. All patient belongings given to patient. Patient transported to main entrance by nurse via wheelchair.

## 2020-07-25 ENCOUNTER — Emergency Department (HOSPITAL_COMMUNITY)
Admission: EM | Admit: 2020-07-25 | Discharge: 2020-07-25 | Disposition: A | Discharge status: Home / Self Care | Payer: BC Managed Care – PPO | Attending: Emergency Medicine | Admitting: Emergency Medicine

## 2020-07-25 ENCOUNTER — Emergency Department (HOSPITAL_COMMUNITY): Payer: BC Managed Care – PPO

## 2020-07-25 DIAGNOSIS — R42 Dizziness and giddiness: Secondary | ICD-10-CM | POA: Diagnosis present

## 2020-07-25 DIAGNOSIS — Z853 Personal history of malignant neoplasm of breast: Secondary | ICD-10-CM | POA: Diagnosis not present

## 2020-07-25 DIAGNOSIS — Z7982 Long term (current) use of aspirin: Secondary | ICD-10-CM | POA: Diagnosis not present

## 2020-07-25 DIAGNOSIS — Z79899 Other long term (current) drug therapy: Secondary | ICD-10-CM | POA: Insufficient documentation

## 2020-07-25 DIAGNOSIS — I10 Essential (primary) hypertension: Secondary | ICD-10-CM | POA: Insufficient documentation

## 2020-07-25 DIAGNOSIS — E876 Hypokalemia: Secondary | ICD-10-CM | POA: Insufficient documentation

## 2020-07-25 LAB — COMPREHENSIVE METABOLIC PANEL
ALT: 16 U/L (ref 0–44)
AST: 20 U/L (ref 15–41)
Albumin: 3.4 g/dL — ABNORMAL LOW (ref 3.5–5.0)
Alkaline Phosphatase: 76 U/L (ref 38–126)
Anion gap: 12 (ref 5–15)
BUN: 12 mg/dL (ref 8–23)
CO2: 23 mmol/L (ref 22–32)
Calcium: 9.1 mg/dL (ref 8.9–10.3)
Chloride: 105 mmol/L (ref 98–111)
Creatinine, Ser: 1.08 mg/dL — ABNORMAL HIGH (ref 0.44–1.00)
GFR, Estimated: 55 mL/min — ABNORMAL LOW (ref >60)
Glucose, Bld: 268 mg/dL — ABNORMAL HIGH (ref 70–99)
Potassium: 3.1 mmol/L — ABNORMAL LOW (ref 3.5–5.1)
Sodium: 140 mmol/L (ref 135–145)
Total Bilirubin: 1.1 mg/dL (ref 0.3–1.2)
Total Protein: 6 g/dL — ABNORMAL LOW (ref 6.5–8.1)

## 2020-07-25 LAB — CBC
HCT: 36.4 % (ref 36.0–46.0)
Hemoglobin: 11.6 g/dL — ABNORMAL LOW (ref 12.0–15.0)
MCH: 28.6 pg (ref 26.0–34.0)
MCHC: 31.9 g/dL (ref 30.0–36.0)
MCV: 89.9 fL (ref 80.0–100.0)
Platelets: 247 10*3/uL (ref 150–400)
RBC: 4.05 MIL/uL (ref 3.87–5.11)
RDW: 13.2 % (ref 11.5–15.5)
WBC: 6.4 10*3/uL (ref 4.0–10.5)
nRBC: 0 % (ref 0.0–0.2)

## 2020-07-25 MED ORDER — AMLODIPINE BESYLATE 5 MG PO TABS: 5.0000 mg | ORAL_TABLET | Freq: Every day | ORAL | 0 refills | Status: AC

## 2020-07-25 MED ORDER — POTASSIUM CHLORIDE CRYS ER 20 MEQ PO TBCR
40.0000 meq | EXTENDED_RELEASE_TABLET | Freq: Once | ORAL | Status: AC
  Administered 2020-07-25: 40 meq via ORAL
  Filled 2020-07-25: qty 2

## 2020-07-25 MED ORDER — AMLODIPINE BESYLATE 5 MG PO TABS
5.0000 mg | ORAL_TABLET | Freq: Once | ORAL | Status: AC
  Administered 2020-07-25: 5 mg via ORAL
  Filled 2020-07-25: qty 1

## 2020-07-25 NOTE — ED Triage Notes (Signed)
Pt here from home with c/o htn , has vertigo but states that her her head was hurting more than usual and she was staggering around the house

## 2020-07-25 NOTE — ED Provider Notes (Signed)
[facility] EMERGENCY DEPARTMENT Provider Note   CSN: [id number] Arrival date & time: 07/25/20  [id number]     History No chief complaint on file.   Rebekah Steele is a 69 y.o. female.  Patient with history of hypertension, breast pancreatic cancer, currently on lisinopril 10 mg daily --presents the emergency department for evaluation of high blood pressure.  Patient states that her blood pressure has been elevated for the past several days.  She brings a list of blood pressures in the 0000000 systolic range.  This morning she noted blood pressure into the low [id number] systolic.  She called her doctor but decided to come to the emergency department.  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  She does take meclizine sometimes but none currently.  She also has headache described as a heavy sensation in her eyes.  This occurs when her blood pressure is high per her report.  She states that she was "staggering around" over the Christmas holiday but she is able to walk. Patient otherwise denies signs of stroke including: facial droop, slurred speech, aphasia, weakness/numbness in extremities.  She states that she was taken off of HCTZ about 3 months ago due to low potassium which has been a problem for her.  No head injury.  No vision changes, chest pain, shortness of breath.         Past Medical History:  Diagnosis Date  . Bilateral leg edema 11/2015  . Cancer [facility])    breast & pancreatic  . GERD (gastroesophageal reflux disease)   . Hypertension   . Trigeminal neuralgia     Patient Active Problem List   Diagnosis Date Noted  . Near syncope 09/20/2019  . Dizziness 09/19/2019    Class: Acute  . Chest pain, precordial 02/25/2017    Class: Acute  . Precordial chest pain 02/25/2017  . Malnutrition of moderate degree 12/27/2015  . Bilateral leg edema 12/26/2015  . Personal history of pancreatic cancer 12/26/2015  . Abnormal weight loss 12/26/2015    Past  Surgical History:  Procedure Laterality Date  . ABDOMINAL HYSTERECTOMY    . BREAST LUMPECTOMY    . CARDIAC CATHETERIZATION N/A 12/31/2015   Procedure: Left Heart Cath and Coronary Angiography;  Surgeon: Dixie Dials, MD;  Location: [facility];  Service: Cardiovascular;  Laterality: N/A;  . CHOLECYSTECTOMY    . FACIAL NERVE SURGERY       OB History   No obstetric history on file.     No family history on file.  Social History   Tobacco Use  . Smoking status: Never Smoker  . Smokeless tobacco: Never Used  Vaping Use  . Vaping Use: Never used  Substance Use Topics  . Alcohol use: No  . Drug use: No    Home Medications Prior to Admission medications   Medication Sig Start Date End Date Taking? Authorizing Provider  acetaminophen (TYLENOL) 500 MG tablet Take 500 mg by mouth every 6 (six) hours as needed. pain    [provider]  amLODipine (NORVASC) 5 MG tablet Take 5 mg by mouth daily.    [provider]  aspirin EC 81 MG tablet Take 81 mg by mouth daily.    [provider]  calcitonin, salmon, (MIACALCIN) 200 UNIT/ACT nasal spray Place 1 spray into alternate nostrils daily as needed (allergies).    [provider]  calcium carbonate (OS-CAL - DOSED IN MG OF ELEMENTAL CALCIUM) 1250 (500 Ca) MG tablet  Take 1 tablet by mouth daily.    [provider]  Cholecalciferol (VITAMIN D-3) 25 MCG (1000 UT) CAPS Take 1 capsule by mouth daily.    [provider]  cycloSPORINE (RESTASIS) 0.05 % ophthalmic emulsion Place 1 drop into both eyes daily.    [provider]  docusate calcium (SURFAK) 240 MG capsule Take 240 mg by mouth daily.    [provider]  Ferrous Sulfate 90 (18 Fe) MG TABS Take 90 mg by mouth every morning.    [provider]  fluocinonide cream (LIDEX) 0.05 % Apply 1 application topically daily as needed (For rash).     [provider]  fluticasone (FLONASE) 50 MCG/ACT nasal spray  Place 1 spray into the nose daily as needed. allergies    [provider]  gabapentin (NEURONTIN) 300 MG capsule Take 300 mg by mouth 3 (three) times daily.    [provider]  Glycerin-Hypromellose-PEG 400 0.2-0.2-1 % SOLN Place 2 drops into the right eye 2 (two) times daily as needed. Dry eyes     [provider]  hydrochlorothiazide (MICROZIDE) 12.5 MG capsule Take 12.5 mg by mouth daily.    [provider]  ipratropium (ATROVENT) 0.06 % nasal spray Place 2 sprays into both nostrils in the morning and at bedtime.     [provider]  lidocaine-prilocaine (EMLA) cream Apply 1 application topically daily as needed (For rash).     [provider]  lipase/protease/amylase (CREON) 12000 units CPEP capsule Take 12,000 Units by mouth 3 (three) times daily before meals.    [provider]  lisinopril (PRINIVIL,ZESTRIL) 2.5 MG tablet Take 1 tablet (2.5 mg total) by mouth daily. 02/26/17   Orpah Cobb, MD  meclizine (ANTIVERT) 12.5 MG tablet Take 1 tablet (12.5 mg total) by mouth 3 (three) times daily. 09/22/19   Orpah Cobb, MD  meloxicam (MOBIC) 7.5 MG tablet Take 7.5 mg by mouth daily.    [provider]  metoprolol tartrate (LOPRESSOR) 25 MG tablet Take 0.5 tablets (12.5 mg total) by mouth 2 (two) times daily. 02/26/17   Orpah Cobb, MD  Multiple Vitamin (MULTI-VITAMINS) TABS Take 1 tablet by mouth daily.    [provider]  omeprazole (PRILOSEC) 40 MG capsule Take 40 mg by mouth in the morning and at bedtime.     [provider]  potassium chloride (KLOR-CON) 10 MEQ tablet Take 1 tablet (10 mEq total) by mouth daily. 09/23/19   Orpah Cobb, MD  solifenacin (VESICARE) 10 MG tablet Take 10 mg by mouth daily.    [provider]    Allergies    Patient has no known allergies.  Review of Systems   Review of Systems  Constitutional: Negative for diaphoresis and fever.  Eyes: Negative for redness.   Respiratory: Negative for cough and shortness of breath.   Cardiovascular: Negative for chest pain, palpitations and leg swelling.  Gastrointestinal: Negative for abdominal pain, nausea and vomiting.  Genitourinary: Negative for dysuria.  Musculoskeletal: Positive for gait problem. Negative for back pain and neck pain.  Skin: Negative for rash.  Neurological: Positive for headaches. Negative for dizziness, seizures, syncope, facial asymmetry, speech difficulty, weakness, light-headedness and numbness.  Psychiatric/Behavioral: The patient is not nervous/anxious.     Physical Exam Updated Vital Signs BP (!) 165/79 (BP Location: Right Arm)   Pulse (!) 56   Temp 98.7 F (37.1 C) (Oral)   Resp 17   SpO2 100%   Physical Exam Vitals and nursing  note reviewed.  Constitutional:      Appearance: She is well-developed and well-nourished. She is not diaphoretic.  HENT:     Head: Normocephalic and atraumatic.     Right Ear: Tympanic membrane, ear canal and external ear normal.     Left Ear: Tympanic membrane, ear canal and external ear normal.     Nose: Nose normal.     Mouth/Throat:     Mouth: Oropharynx is clear and moist and mucous membranes are normal. Mucous membranes are not dry.     Pharynx: Uvula midline.  Eyes:     General: Lids are normal.     Extraocular Movements: EOM normal.     Right eye: No nystagmus.     Left eye: No nystagmus.     Conjunctiva/sclera: Conjunctivae normal.     Pupils: Pupils are equal, round, and reactive to light.  Neck:     Vascular: Normal carotid pulses. No carotid bruit or JVD.     Trachea: Trachea normal. No tracheal deviation.  Cardiovascular:     Rate and Rhythm: Normal rate and regular rhythm.     Pulses: Intact distal pulses. No decreased pulses.     Heart sounds: Normal heart sounds, S1 normal and S2 normal. No murmur heard.   Pulmonary:     Effort: Pulmonary effort is normal. No respiratory distress.     Breath sounds: Normal breath  sounds. No wheezing.  Chest:     Chest wall: No tenderness.  Abdominal:     General: Bowel sounds are normal. Aorta is normal.     Palpations: Abdomen is soft.     Tenderness: There is no abdominal tenderness. There is no guarding or rebound.  Musculoskeletal:        General: Normal range of motion.     Cervical back: Normal range of motion and neck supple. No tenderness or bony tenderness. No muscular tenderness. Normal range of motion.     Right lower leg: Edema present.     Left lower leg: Edema present.  Skin:    General: Skin is warm and dry.     Coloration: Skin is not pale.     Nails: There is no cyanosis.  Neurological:     Mental Status: She is alert and oriented to person, place, and time.     GCS: GCS eye subscore is 4. GCS verbal subscore is 5. GCS motor subscore is 6.     Cranial Nerves: No cranial nerve deficit.     Sensory: No sensory deficit.     Coordination: She displays a negative Romberg sign. Coordination normal.     Gait: Gait normal.     Deep Tendon Reflexes: Strength normal and reflexes are normal and symmetric.  Psychiatric:        Mood and Affect: Mood and affect normal.     ED Results / Procedures / Treatments   Labs (all labs ordered are listed, but only abnormal results are displayed) Labs Reviewed  CBC - Abnormal; Notable for the following components:      Result Value   Hemoglobin 11.6 (*)    All other components within normal limits  COMPREHENSIVE METABOLIC PANEL - Abnormal; Notable for the following components:   Potassium 3.1 (*)    Glucose, Bld 268 (*)    Creatinine, Ser 1.08 (*)    Total Protein 6.0 (*)    Albumin 3.4 (*)    GFR, Estimated 55 (*)    All other components within normal limits  EKG None  Radiology CT Head Wo Contrast  Result Date: 07/25/2020 CLINICAL DATA:  Dizziness. EXAM: CT HEAD WITHOUT CONTRAST TECHNIQUE: Contiguous axial images were obtained from the base of the skull through the vertex without intravenous  contrast. COMPARISON:  CT head September 19, 2019. FINDINGS: Brain: No evidence of acute large vascular territory infarction, hemorrhage, hydrocephalus, extra-axial collection or mass lesion/mass effect. Similar right cerebellar encephalomalacia subjacent to the craniotomy site. Vascular: No hyperdense vessel identified. Skull: Prior right occipital craniotomy.  No acute fracture. Sinuses/Orbits: Mucosal thickening bilateral maxillary sinuses and scattered ethmoid air cells. No air-fluid levels. Unremarkable orbits. Other: No mastoid effusions. IMPRESSION: 1. No evidence of acute intracranial abnormality. 2. Right occipital craniotomy with similar subjacent right cerebellar encephalomalacia. Electronically Signed   By: Margaretha Sheffield MD   On: 07/25/2020 07:27    Procedures Procedures (including critical care time)  Medications Ordered in ED Medications  amLODipine (NORVASC) tablet 5 mg (has no administration in time range)  potassium chloride SA (KLOR-CON) CR tablet 40 mEq (has no administration in time range)    ED Course  I have reviewed the triage vital signs and the nursing notes.  Pertinent labs & imaging results that were available during my care of the patient were reviewed by me and considered in my medical decision making (see chart for details).  Patient seen and examined. Work-up reviewed.  Labs show hypokalemia and hyperglycemia without ketosis.  Head CT with chronic changes.  Patient has a very benign and reassuring exam.  Neurologically she is intact.  Will give a dose of amlodipine.  She has been on this in the past.  Discussed with patient that ultimately her PCP/cardiologist will need to determine best blood pressure treatment.  Will give a dose of potassium given hypokalemia here.  Discussed with Dr. Ashok Cordia who will see patient.  Vital signs reviewed and are as follows: BP (!) 165/79 (BP Location: Right Arm)   Pulse (!) 56   Temp 98.7 F (37.1 C) (Oral)   Resp 17   SpO2  100%   Patient seen by Dr. Ashok Cordia.  She is cleared for discharge to home.  Will restart amlodipine 5 mg.  Discussed with patient that her primary care/cardiologist will ultimately decide the best blood pressure medication and dosage for her.  I encouraged her to continue tracking her blood pressures as this can be helpful especially with new medication. She is ready to leave stating she is very hungry.   Patient counseled to return if they have new weakness in their arms or legs, slurred speech, trouble walking or talking, confusion, worsening trouble with their balance more than previously noticed, or if they have any other concerns. Patient verbalizes understanding and agrees with plan.      MDM Rules/Calculators/A&P                          Patient here with concerns over high blood pressure.  Patient has associated headaches and vertigo which she reports is exacerbated when her blood pressure is high.  All of the symptoms are chronic in nature and she has dealt with them over the years.  She does not have any new findings of stroke such as difficulty speaking, unilateral weakness, or worse than baseline balance difficulties.  Patient is ambulatory.  She otherwise has a reassuring neuro exam.  No other acute signs of endorgan damage.  Patient was started on 5 mg of amlodipine and encouraged to follow-up with  her primary care doctor.  CT of the head was unremarkable with no acute changes.   Final Clinical Impression(s) / ED Diagnoses Final diagnoses:  Hypertension, unspecified type  Hypokalemia    Rx / DC Orders ED Discharge Orders         Ordered    amLODipine (NORVASC) 5 MG tablet  Daily        07/25/20 1315           Carlisle Cater, PA-C 07/25/20 1319    Lajean Saver, MD 07/25/20 1331

## 2020-07-25 NOTE — Discharge Instructions (Signed)
Please read and follow all provided instructions.  Your diagnoses today include:  1. Hypertension, unspecified type   2. Hypokalemia     Your blood pressure was high today (BP): BP (!) 169/82 (BP Location: Right Arm)   Pulse 60   Temp 98.7 F (37.1 C) (Oral)   Resp 16   SpO2 100%   Tests performed today include:  Vital signs. See below for your results today.  Blood cell counts (white, red, and platelets) Electrolytes - shows low potassium and high blood sugar Kidney function test  CT of your head - no signs of new strokes or other problems  Medications prescribed:   Amlodipine - medication for high blood pressure  Home care instructions:  Follow any educational materials contained in this packet.  Follow-up instructions: Please follow-up with your primary care provider in the next 7 days for a recheck of your symptoms and blood pressure.  They will ultimately decide what blood pressure medication they want you on and what dose.  Return instructions:   Please return to the Emergency Department if you experience worsening symptoms.   Return with severe chest pain, abdominal pain, or shortness of breath.   Return with severe headache, focal weakness, numbness, difficulty with speech or vision.  Return with loss of vision or sudden vision change.  Please return if you have any other emergent concerns.  Additional Information:  Your vital signs today were: BP (!) 169/82 (BP Location: Right Arm)   Pulse 60   Temp 98.7 F (37.1 C) (Oral)   Resp 16   SpO2 100%  If your blood pressure (BP) was elevated above 135/85 this visit, please have this repeated by your doctor within one month. --------------

## 2021-05-15 ENCOUNTER — Observation Stay (HOSPITAL_COMMUNITY)
Admission: AD | Admit: 2021-05-15 | Discharge: 2021-05-17 | Disposition: A | Discharge status: Home / Self Care | Payer: Medicare HMO | Source: Ambulatory Visit | Attending: Cardiovascular Disease | Admitting: Cardiovascular Disease

## 2021-05-15 ENCOUNTER — Observation Stay (HOSPITAL_COMMUNITY): Payer: Medicare HMO

## 2021-05-15 DIAGNOSIS — Z7982 Long term (current) use of aspirin: Secondary | ICD-10-CM | POA: Diagnosis not present

## 2021-05-15 DIAGNOSIS — Z79899 Other long term (current) drug therapy: Secondary | ICD-10-CM | POA: Insufficient documentation

## 2021-05-15 DIAGNOSIS — Z20822 Contact with and (suspected) exposure to covid-19: Secondary | ICD-10-CM | POA: Diagnosis not present

## 2021-05-15 DIAGNOSIS — I10 Essential (primary) hypertension: Secondary | ICD-10-CM | POA: Diagnosis present

## 2021-05-15 DIAGNOSIS — Z8507 Personal history of malignant neoplasm of pancreas: Secondary | ICD-10-CM | POA: Diagnosis present

## 2021-05-15 DIAGNOSIS — Z853 Personal history of malignant neoplasm of breast: Secondary | ICD-10-CM | POA: Diagnosis not present

## 2021-05-15 DIAGNOSIS — E440 Moderate protein-calorie malnutrition: Secondary | ICD-10-CM | POA: Diagnosis present

## 2021-05-15 DIAGNOSIS — R42 Dizziness and giddiness: Secondary | ICD-10-CM | POA: Diagnosis not present

## 2021-05-15 DIAGNOSIS — R112 Nausea with vomiting, unspecified: Secondary | ICD-10-CM | POA: Diagnosis present

## 2021-05-15 MED ORDER — ACETAMINOPHEN 325 MG PO TABS
650.0000 mg | ORAL_TABLET | Freq: Four times a day (QID) | ORAL | Status: DC | PRN
  Administered 2021-05-16: 650 mg via ORAL
  Filled 2021-05-15: qty 2

## 2021-05-15 MED ORDER — AMLODIPINE BESYLATE 5 MG PO TABS
5.0000 mg | ORAL_TABLET | Freq: Every day | ORAL | Status: DC
  Administered 2021-05-16 – 2021-05-17 (×2): 5 mg via ORAL
  Filled 2021-05-15 (×2): qty 1

## 2021-05-15 MED ORDER — LIDOCAINE-PRILOCAINE 2.5-2.5 % EX CREA
1.0000 "application " | TOPICAL_CREAM | Freq: Every day | CUTANEOUS | Status: DC | PRN
  Filled 2021-05-15: qty 5

## 2021-05-15 MED ORDER — METOPROLOL TARTRATE 12.5 MG HALF TABLET
12.5000 mg | ORAL_TABLET | Freq: Two times a day (BID) | ORAL | Status: DC
  Administered 2021-05-16 (×2): 12.5 mg via ORAL
  Filled 2021-05-15 (×3): qty 1

## 2021-05-15 MED ORDER — DEXTROSE-NACL 5-0.45 % IV SOLN: INTRAVENOUS | Status: DC

## 2021-05-15 MED ORDER — ONDANSETRON HCL 4 MG PO TABS
4.0000 mg | ORAL_TABLET | Freq: Four times a day (QID) | ORAL | Status: DC | PRN
  Administered 2021-05-16: 4 mg via ORAL
  Filled 2021-05-15: qty 1

## 2021-05-15 MED ORDER — FLUTICASONE PROPIONATE 50 MCG/ACT NA SUSP
1.0000 | Freq: Every day | NASAL | Status: DC
  Administered 2021-05-16 – 2021-05-17 (×2): 1 via NASAL
  Filled 2021-05-15: qty 16

## 2021-05-15 MED ORDER — LISINOPRIL 5 MG PO TABS
2.5000 mg | ORAL_TABLET | Freq: Every day | ORAL | Status: DC
  Administered 2021-05-16 – 2021-05-17 (×2): 2.5 mg via ORAL
  Filled 2021-05-15 (×2): qty 1

## 2021-05-15 MED ORDER — ACETAMINOPHEN 650 MG RE SUPP: 650.0000 mg | Freq: Four times a day (QID) | RECTAL | Status: DC | PRN

## 2021-05-15 MED ORDER — PANTOPRAZOLE SODIUM 40 MG PO TBEC
40.0000 mg | DELAYED_RELEASE_TABLET | Freq: Every day | ORAL | Status: DC
  Administered 2021-05-16 – 2021-05-17 (×2): 40 mg via ORAL
  Filled 2021-05-15 (×2): qty 1

## 2021-05-15 MED ORDER — PANCRELIPASE (LIP-PROT-AMYL) 12000-38000 UNITS PO CPEP
12000.0000 [IU] | ORAL_CAPSULE | Freq: Three times a day (TID) | ORAL | Status: DC
  Administered 2021-05-16 – 2021-05-17 (×5): 12000 [IU] via ORAL
  Filled 2021-05-15 (×5): qty 1

## 2021-05-15 MED ORDER — MECLIZINE HCL 25 MG PO TABS
12.5000 mg | ORAL_TABLET | Freq: Three times a day (TID) | ORAL | Status: DC
  Administered 2021-05-16 – 2021-05-17 (×5): 12.5 mg via ORAL
  Filled 2021-05-15 (×5): qty 1

## 2021-05-15 MED ORDER — HEPARIN SODIUM (PORCINE) 5000 UNIT/ML IJ SOLN
5000.0000 [IU] | Freq: Three times a day (TID) | INTRAMUSCULAR | Status: DC
  Administered 2021-05-16 – 2021-05-17 (×4): 5000 [IU] via SUBCUTANEOUS
  Filled 2021-05-15 (×4): qty 1

## 2021-05-15 MED ORDER — ONDANSETRON HCL 4 MG/2ML IJ SOLN: 4.0000 mg | Freq: Four times a day (QID) | INTRAMUSCULAR | Status: DC | PRN

## 2021-05-15 MED ORDER — CYCLOSPORINE 0.05 % OP EMUL
1.0000 [drp] | Freq: Every day | OPHTHALMIC | Status: DC
  Administered 2021-05-16 – 2021-05-17 (×2): 1 [drp] via OPHTHALMIC
  Filled 2021-05-15 (×2): qty 30

## 2021-05-15 NOTE — H&P (Signed)
Referring Physician: Dixie Dials, MD  Rebekah Steele is an 70 y.o. female.                       Chief Complaint: Dizziness  HPI: 70 years old black female with PMH of breast and pancreatic cancer, s/p surgeries for cancer, HTN, GERD, protein calorie malnutrition has dizziness, nausea and vomiting without chest pain, fever, earache or GI bleed. Her blood pressure is elevated from not able to keep BP medications down.  Past Medical History:  Diagnosis Date   Bilateral leg edema 11/2015   Cancer [facility])    breast & pancreatic   GERD (gastroesophageal reflux disease)    Hypertension    Trigeminal neuralgia       Past Surgical History:  Procedure Laterality Date   ABDOMINAL HYSTERECTOMY     BREAST LUMPECTOMY     CARDIAC CATHETERIZATION N/A 12/31/2015   Procedure: Left Heart Cath and Coronary Angiography;  Surgeon: Dixie Dials, MD;  Location: [facility];  Service: Cardiovascular;  Laterality: N/A;   CHOLECYSTECTOMY     FACIAL NERVE SURGERY      No family history on file. Social History:  reports that she has never smoked. She has never used smokeless tobacco. She reports that she does not drink alcohol and does not use drugs.  Allergies: No Known Allergies  Medications Prior to Admission  Medication Sig Dispense Refill   acetaminophen (TYLENOL) 500 MG tablet Take 500 mg by mouth every 6 (six) hours as needed. pain     amLODipine (NORVASC) 5 MG tablet Take 1 tablet (5 mg total) by mouth daily. 30 tablet 0   aspirin EC 81 MG tablet Take 81 mg by mouth daily.     calcitonin, salmon, (MIACALCIN) 200 UNIT/ACT nasal spray Place 1 spray into alternate nostrils daily as needed (allergies).     calcium carbonate (OS-CAL - DOSED IN MG OF ELEMENTAL CALCIUM) 1250 (500 Ca) MG tablet Take 1 tablet by mouth daily.     Cholecalciferol (VITAMIN D-3) 25 MCG (1000 UT) CAPS Take 1 capsule by mouth daily.     cycloSPORINE (RESTASIS) 0.05 % ophthalmic emulsion Place 1 drop into both eyes daily.      docusate calcium (SURFAK) 240 MG capsule Take 240 mg by mouth daily.     Ferrous Sulfate 90 (18 Fe) MG TABS Take 90 mg by mouth every morning.     fluocinonide cream (LIDEX) 3.78 % Apply 1 application topically daily as needed (For rash).      fluticasone (FLONASE) 50 MCG/ACT nasal spray Place 1 spray into the nose daily as needed. allergies     gabapentin (NEURONTIN) 300 MG capsule Take 300 mg by mouth 3 (three) times daily.     Glycerin-Hypromellose-PEG 400 0.2-0.2-1 % SOLN Place 2 drops into the right eye 2 (two) times daily as needed. Dry eyes      hydrochlorothiazide (MICROZIDE) 12.5 MG capsule Take 12.5 mg by mouth daily.     ipratropium (ATROVENT) 0.06 % nasal spray Place 2 sprays into both nostrils in the morning and at bedtime.      lidocaine-prilocaine (EMLA) cream Apply 1 application topically daily as needed (For rash).      lipase/protease/amylase (CREON) 12000 units CPEP capsule Take 12,000 Units by mouth 3 (three) times daily before meals.     lisinopril (PRINIVIL,ZESTRIL) 2.5 MG tablet Take 1 tablet (2.5 mg total) by mouth daily. 30 tablet 3   meclizine (ANTIVERT) 12.5 MG  tablet Take 1 tablet (12.5 mg total) by mouth 3 (three) times daily. 90 tablet 3   meloxicam (MOBIC) 7.5 MG tablet Take 7.5 mg by mouth daily.     metoprolol tartrate (LOPRESSOR) 25 MG tablet Take 0.5 tablets (12.5 mg total) by mouth 2 (two) times daily. 30 tablet 3   Multiple Vitamin (MULTI-VITAMINS) TABS Take 1 tablet by mouth daily.     omeprazole (PRILOSEC) 40 MG capsule Take 40 mg by mouth in the morning and at bedtime.      potassium chloride (KLOR-CON) 10 MEQ tablet Take 1 tablet (10 mEq total) by mouth daily. 30 tablet 3   solifenacin (VESICARE) 10 MG tablet Take 10 mg by mouth daily.      No results found for this or any previous visit (from the past 48 hour(s)). No results found.  Review Of Systems Constitutional: No fever, chills, Positive chronic weight loss. Eyes: No vision change, wears  glasses. No discharge or pain. Ears: No hearing loss, No tinnitus. Respiratory: No asthma, COPD, pneumonias. Positive shortness of breath. No hemoptysis. Cardiovascular: No chest pain, palpitation, leg edema. Gastrointestinal: Positive nausea, vomiting, no diarrhea, constipation. No GI bleed. No hepatitis. Genitourinary: No dysuria, hematuria, kidney stone. No incontinance. Neurological: Positive headache, no stroke, no seizures.  Psychiatry: No psych facility admission for anxiety, depression, suicide. No detox. Skin: No rash. Musculoskeletal: Positive joint pain, no fibromyalgia. No neck pain, back pain. Lymphadenopathy: No lymphadenopathy. Hematology: No anemia or easy bruising.   Blood pressure (!) 164/102, pulse 71, temperature 98.6 F (37 C), temperature source Oral, resp. rate 18, height 5\' 7"  (1.702 m), weight 63 kg, SpO2 100 %. Body mass index is 21.77 kg/m. General appearance: alert, cooperative, appears stated age and mild  distress Head: Normocephalic, atraumatic. Eyes: Brown eyes, pink conjunctiva, corneas clear.  Neck: No adenopathy, no carotid bruit, no JVD, supple, symmetrical, trachea midline and thyroid not enlarged. Resp: Clear to auscultation bilaterally. Cardio: Regular rate and rhythm, S1, S2 normal, II/VI systolic murmur, no click, rub or gallop GI: Soft, non-tender; bowel sounds normal; no organomegaly. Extremities: No edema, cyanosis or clubbing. Skin: Warm and dry.  Neurologic: Alert and oriented X 3, normal strength. Normal coordination and slow gait.  Assessment/Plan Dizziness Nausea and vomiting HTN Moderate protein calorie malnutrition S/P breast cancer with surgery S/P Pancreatic cancer with surgery  Plan: Place in observation Blood work, EKG, CT head, X-ray abdomen and chest.  Time spent: Review of old records, Lab, x-rays, EKG, other cardiac tests, examination, discussion with patient/Nurse over 70 minutes.  Birdie Riddle, MD  05/15/2021,  11:48 PM

## 2021-05-15 NOTE — Progress Notes (Signed)
Patient arrived to unit, VSS (BP elevated), MD notified about patients arrival.

## 2021-05-16 ENCOUNTER — Observation Stay (HOSPITAL_COMMUNITY): Payer: Medicare HMO

## 2021-05-16 ENCOUNTER — Other Ambulatory Visit: Payer: Self-pay

## 2021-05-16 ENCOUNTER — Encounter (HOSPITAL_COMMUNITY): Payer: Self-pay | Admitting: Cardiovascular Disease

## 2021-05-16 DIAGNOSIS — R42 Dizziness and giddiness: Secondary | ICD-10-CM | POA: Diagnosis not present

## 2021-05-16 LAB — CBC WITH DIFFERENTIAL/PLATELET
Abs Immature Granulocytes: 0.01 10*3/uL (ref 0.00–0.07)
Basophils Absolute: 0 10*3/uL (ref 0.0–0.1)
Basophils Relative: 1 %
Eosinophils Absolute: 0.2 10*3/uL (ref 0.0–0.5)
Eosinophils Relative: 4 %
HCT: 33.9 % — ABNORMAL LOW (ref 36.0–46.0)
Hemoglobin: 10.7 g/dL — ABNORMAL LOW (ref 12.0–15.0)
Immature Granulocytes: 0 %
Lymphocytes Relative: 20 %
Lymphs Abs: 1.1 10*3/uL (ref 0.7–4.0)
MCH: 28.2 pg (ref 26.0–34.0)
MCHC: 31.6 g/dL (ref 30.0–36.0)
MCV: 89.4 fL (ref 80.0–100.0)
Monocytes Absolute: 0.6 10*3/uL (ref 0.1–1.0)
Monocytes Relative: 11 %
Neutro Abs: 3.6 10*3/uL (ref 1.7–7.7)
Neutrophils Relative %: 64 %
Platelets: 224 10*3/uL (ref 150–400)
RBC: 3.79 MIL/uL — ABNORMAL LOW (ref 3.87–5.11)
RDW: 13.2 % (ref 11.5–15.5)
WBC: 5.6 10*3/uL (ref 4.0–10.5)
nRBC: 0 % (ref 0.0–0.2)

## 2021-05-16 LAB — RESP PANEL BY RT-PCR (FLU A&B, COVID) ARPGX2
Influenza A by PCR: NEGATIVE
Influenza B by PCR: NEGATIVE
SARS Coronavirus 2 by RT PCR: NEGATIVE

## 2021-05-16 LAB — COMPREHENSIVE METABOLIC PANEL
ALT: 14 U/L (ref 0–44)
AST: 14 U/L — ABNORMAL LOW (ref 15–41)
Albumin: 3 g/dL — ABNORMAL LOW (ref 3.5–5.0)
Alkaline Phosphatase: 71 U/L (ref 38–126)
Anion gap: 7 (ref 5–15)
BUN: 16 mg/dL (ref 8–23)
CO2: 26 mmol/L (ref 22–32)
Calcium: 8.8 mg/dL — ABNORMAL LOW (ref 8.9–10.3)
Chloride: 110 mmol/L (ref 98–111)
Creatinine, Ser: 0.93 mg/dL (ref 0.44–1.00)
GFR, Estimated: >60 mL/min (ref >60)
Glucose, Bld: 92 mg/dL (ref 70–99)
Potassium: 3.4 mmol/L — ABNORMAL LOW (ref 3.5–5.1)
Sodium: 143 mmol/L (ref 135–145)
Total Bilirubin: 0.6 mg/dL (ref 0.3–1.2)
Total Protein: 5.5 g/dL — ABNORMAL LOW (ref 6.5–8.1)

## 2021-05-16 LAB — HEMOGLOBIN A1C
Hgb A1c MFr Bld: 6.1 % — ABNORMAL HIGH (ref 4.8–5.6)
Mean Plasma Glucose: 128 mg/dL

## 2021-05-16 LAB — ECHOCARDIOGRAM COMPLETE
AR max vel: 1.74 cm2
AV Area VTI: 1.63 cm2
AV Area mean vel: 1.47 cm2
AV Mean grad: 4 mmHg
AV Peak grad: 5.9 mmHg
Ao pk vel: 1.21 m/s
Area-P 1/2: 4.8 cm2
Height: 67 in
S' Lateral: 3.3 cm
Weight: 2216 oz

## 2021-05-16 LAB — URINALYSIS, ROUTINE W REFLEX MICROSCOPIC
Bacteria, UA: NONE SEEN
Bilirubin Urine: NEGATIVE
Glucose, UA: 50 mg/dL — AB
Hgb urine dipstick: NEGATIVE
Ketones, ur: NEGATIVE mg/dL
Nitrite: NEGATIVE
Protein, ur: NEGATIVE mg/dL
Specific Gravity, Urine: 1.015 (ref 1.005–1.030)
pH: 5 (ref 5.0–8.0)

## 2021-05-16 LAB — AMMONIA: Ammonia: 21 umol/L (ref 9–35)

## 2021-05-16 LAB — LIPASE, BLOOD: Lipase: 21 U/L (ref 11–51)

## 2021-05-16 MED ORDER — LIDOCAINE-PRILOCAINE 2.5-2.5 % EX CREA
1.0000 "application " | TOPICAL_CREAM | Freq: Every day | CUTANEOUS | Status: DC | PRN
  Administered 2021-05-16: 1 via TOPICAL
  Filled 2021-05-16: qty 5

## 2021-05-16 MED ORDER — SODIUM CHLORIDE 0.9 % IV SOLN: 1.0000 g | INTRAVENOUS | Status: DC

## 2021-05-16 MED ORDER — CHLORHEXIDINE GLUCONATE CLOTH 2 % EX PADS
6.0000 | MEDICATED_PAD | Freq: Every day | CUTANEOUS | Status: DC
  Administered 2021-05-16 – 2021-05-17 (×2): 6 via TOPICAL

## 2021-05-16 MED ORDER — SODIUM CHLORIDE 0.9 % IV SOLN
500.0000 mg | INTRAVENOUS | Status: DC
  Administered 2021-05-16 – 2021-05-17 (×2): 500 mg via INTRAVENOUS
  Filled 2021-05-16 (×2): qty 500

## 2021-05-16 MED ORDER — SODIUM CHLORIDE 0.9% FLUSH
10.0000 mL | Freq: Two times a day (BID) | INTRAVENOUS | Status: DC
Start: 2021-05-16 — End: 2021-05-17
  Administered 2021-05-16 – 2021-05-17 (×2): 10 mL

## 2021-05-16 MED ORDER — SODIUM CHLORIDE 0.9% FLUSH: 10.0000 mL | INTRAVENOUS | Status: DC | PRN

## 2021-05-16 NOTE — Progress Notes (Signed)
Ref: Dixie Dials, MD   Subjective:  Awake. No chest pain. Nausea and headache continues. BP improving but still high. Feels more sleepy.  CXR, Abdominal x-ray and CT brain are stable. Positive harsh cough.  Objective:  Vital Signs in the last 24 hours: Temp:  [97.9 F (36.6 C)-98.6 F (37 C)] 98.2 F (36.8 C) (10/20 0742) Pulse Rate:  [57-71] 57 (10/20 0742) Cardiac Rhythm: Normal sinus rhythm (10/19 2237) Resp:  [16-18] 16 (10/20 0742) BP: (160-166)/(85-102) 160/91 (10/20 0742) SpO2:  [98 %-100 %] 100 % (10/20 0742) Weight:  [62.8 kg-63 kg] 62.8 kg (10/20 0538)  Physical Exam: BP Readings from Last 1 Encounters:  05/16/21 (!) 160/91     Wt Readings from Last 1 Encounters:  05/16/21 62.8 kg    Weight change:  Body mass index is 21.69 kg/m. HEENT: [place]/AT, Eyes-Brown, Conjunctiva-Pink, Sclera-Non-icteric Neck: No JVD, No bruit, Trachea midline. Lungs:  Harsh, Bilateral. Cardiac:  Regular rhythm, normal S1 and S2, no S3. II/VI systolic murmur. Abdomen:  Soft, non-tender. BS present. Extremities:  No edema present. No cyanosis. No clubbing. CNS: AxOx3, Cranial nerves grossly intact, moves all 4 extremities.  Skin: Warm and dry.   Intake/Output from previous day: 10/19 0701 - 10/20 0700 In: 599.3 [P.O.:480; I.V.:119.3] Out: 600 [Urine:600]    Lab Results: BMET    Component Value Date/Time   NA 143 05/16/2021 0001   NA 140 07/25/2020 0638   NA 143 09/20/2019 0540   K 3.4 (L) 05/16/2021 0001   K 3.1 (L) 07/25/2020 0638   K 3.5 09/20/2019 0540   CL 110 05/16/2021 0001   CL 105 07/25/2020 0638   CL 110 09/20/2019 0540   CO2 26 05/16/2021 0001   CO2 23 07/25/2020 0638   CO2 27 09/20/2019 0540   GLUCOSE 92 05/16/2021 0001   GLUCOSE 268 (H) 07/25/2020 0638   GLUCOSE 92 09/20/2019 0540   BUN 16 05/16/2021 0001   BUN 12 07/25/2020 0638   BUN 19 09/20/2019 0540   CREATININE 0.93 05/16/2021 0001   CREATININE 1.08 (H) 07/25/2020 0638   CREATININE 1.23 (H)  09/20/2019 0540   CALCIUM 8.8 (L) 05/16/2021 0001   CALCIUM 9.1 07/25/2020 0638   CALCIUM 9.0 09/20/2019 0540   GFRNONAA >60 05/16/2021 0001   GFRNONAA 55 (L) 07/25/2020 0638   GFRNONAA 45 (L) 09/20/2019 0540   GFRNONAA 48 (L) 09/19/2019 1839   GFRNONAA >60 02/26/2017 0412   GFRAA 52 (L) 09/20/2019 0540   GFRAA 55 (L) 09/19/2019 1839   GFRAA >60 02/26/2017 0412   CBC    Component Value Date/Time   WBC 5.6 05/16/2021 0001   RBC 3.79 (L) 05/16/2021 0001   HGB 10.7 (L) 05/16/2021 0001   HGB 12.0 08/31/2007 1136   HCT 33.9 (L) 05/16/2021 0001   HCT 35.8 08/31/2007 1136   PLT 224 05/16/2021 0001   PLT 247 08/31/2007 1136   MCV 89.4 05/16/2021 0001   MCV 83.4 08/31/2007 1136   MCH 28.2 05/16/2021 0001   MCHC 31.6 05/16/2021 0001   RDW 13.2 05/16/2021 0001   RDW 14.9 (H) 08/31/2007 1136   LYMPHSABS 1.1 05/16/2021 0001   LYMPHSABS 1.2 08/31/2007 1136   MONOABS 0.6 05/16/2021 0001   MONOABS 0.5 08/31/2007 1136   EOSABS 0.2 05/16/2021 0001   EOSABS 0.2 08/31/2007 1136   BASOSABS 0.0 05/16/2021 0001   BASOSABS 0.0 08/31/2007 1136   HEPATIC Function Panel Recent Labs    07/25/20 0638 05/16/21 0001  PROT 6.0* 5.5*  HEMOGLOBIN A1C No components found for: HGA1C,  MPG CARDIAC ENZYMES Lab Results  Component Value Date   TROPONINI <0.03 02/26/2017   TROPONINI <0.03 02/25/2017   TROPONINI <0.03 02/25/2017   BNP No results for input(s): PROBNP in the last 8760 hours. TSH No results for input(s): TSH in the last 8760 hours. CHOLESTEROL No results for input(s): CHOL in the last 8760 hours.  Scheduled Meds:  amLODipine  5 mg Oral Daily   Chlorhexidine Gluconate Cloth  6 each Topical Daily   cycloSPORINE  1 drop Both Eyes Daily   fluticasone  1 spray Each Nare Daily   heparin  5,000 Units Subcutaneous Q8H   lipase/protease/amylase  12,000 Units Oral TID AC   lisinopril  2.5 mg Oral Daily   meclizine  12.5 mg Oral TID   metoprolol tartrate  12.5 mg Oral BID    pantoprazole  40 mg Oral Daily   sodium chloride flush  10-40 mL Intracatheter Q12H   Continuous Infusions:  cefTRIAXone (ROCEPHIN)  IV     dextrose 5 % and 0.45% NaCl 50 mL/hr at 05/16/21 0534   PRN Meds:.acetaminophen **OR** acetaminophen, lidocaine-prilocaine, ondansetron **OR** ondansetron (ZOFRAN) IV, sodium chloride flush  Assessment/Plan: Dizziness Nausea and vomiting Acute bronchitis HTN Moderate protein calorie malnutrition S/P breast cancer with surgery S/P pancreatic cancer with surgery  Plan:  Add Rocephin IV, followed by PO antibiotics when tolerated. Check ammonia level. Consider phenergan use if IV zofran do not work.   LOS: 0 days   Time spent including chart review, lab review, examination, discussion with patient/Nurse : 30 min   Dixie Dials  MD  05/16/2021, 9:09 AM

## 2021-05-16 NOTE — Progress Notes (Signed)
  Echocardiogram 2D Echocardiogram has been performed.  Rebekah Steele F 05/16/2021, 11:35 AM

## 2021-05-16 NOTE — Progress Notes (Signed)
Patient requests a new diet order and also metoprolol was held, provider notified. Awaiting decision on medication.

## 2021-05-16 NOTE — Plan of Care (Signed)

## 2021-05-17 DIAGNOSIS — R42 Dizziness and giddiness: Secondary | ICD-10-CM | POA: Diagnosis not present

## 2021-05-17 MED ORDER — DIPHENHYDRAMINE HCL 25 MG PO CAPS: 25.0000 mg | ORAL_CAPSULE | Freq: Three times a day (TID) | ORAL | Status: DC | PRN

## 2021-05-17 MED ORDER — ONDANSETRON HCL 4 MG PO TABS: 4.0000 mg | ORAL_TABLET | Freq: Three times a day (TID) | ORAL | 1 refills | Status: AC | PRN

## 2021-05-17 MED ORDER — FERROUS SULFATE 325 (65 FE) MG PO TABS: 325.0000 mg | ORAL_TABLET | Freq: Every day | ORAL | Status: DC

## 2021-05-17 MED ORDER — LETROZOLE 2.5 MG PO TABS: 2.5000 mg | ORAL_TABLET | Freq: Every day | ORAL | Status: DC

## 2021-05-17 MED ORDER — LETROZOLE 2.5 MG PO TABS
2.5000 mg | ORAL_TABLET | Freq: Every day | ORAL | Status: DC
  Administered 2021-05-17: 2.5 mg via ORAL
  Filled 2021-05-17: qty 1

## 2021-05-17 MED ORDER — DULOXETINE HCL 30 MG PO CPEP: 30.0000 mg | ORAL_CAPSULE | Freq: Every day | ORAL | Status: DC

## 2021-05-17 MED ORDER — OXYBUTYNIN CHLORIDE 5 MG PO TABS: 5.0000 mg | ORAL_TABLET | Freq: Every day | ORAL | Status: DC

## 2021-05-17 MED ORDER — HEPARIN SOD (PORK) LOCK FLUSH 100 UNIT/ML IV SOLN
500.0000 [IU] | Freq: Once | INTRAVENOUS | Status: AC
  Administered 2021-05-17: 500 [IU]
  Filled 2021-05-17: qty 5

## 2021-05-17 MED ORDER — METHOCARBAMOL 500 MG PO TABS: 750.0000 mg | ORAL_TABLET | Freq: Three times a day (TID) | ORAL | Status: DC | PRN

## 2021-05-17 MED ORDER — VITAMIN D 25 MCG (1000 UNIT) PO TABS: 1000.0000 [IU] | ORAL_TABLET | Freq: Every day | ORAL | Status: DC

## 2021-05-17 MED ORDER — AZITHROMYCIN 500 MG PO TABS: ORAL_TABLET | ORAL | 0 refills | Status: DC

## 2021-05-17 MED ORDER — LISINOPRIL 10 MG PO TABS: 10.0000 mg | ORAL_TABLET | Freq: Every day | ORAL | Status: DC

## 2021-05-17 MED ORDER — AZITHROMYCIN 250 MG PO TABS: 500.0000 mg | ORAL_TABLET | Freq: Once | ORAL | Status: DC

## 2021-05-17 NOTE — Plan of Care (Signed)

## 2021-05-17 NOTE — Plan of Care (Signed)
  Problem: Nutrition: Goal: Adequate nutrition will be maintained Outcome: Progressing   

## 2021-05-17 NOTE — Discharge Summary (Addendum)
Physician Discharge Summary  Patient ID: Rebekah Steele MRN: [id number] DOB/AGE: Sep 05, 1950 70 y.o.  Admit date: 05/15/2021 Discharge date: 05/17/2021  Admission Diagnoses: Dizziness Nausea and vomiting HTN Moderate protein calorie malnutrition S/P breast cancer with surgery S/P Pancreatic cancer with surgery  Discharge Diagnoses:  Principal Problem:   Dizziness Active Problems:   Acute bronchitis   Personal history of pancreatic cancer   Anemia of chronic disease   Malnutrition of moderate degree   Nausea and vomiting   Essential hypertension   Moderate MR and TR    Discharged Condition: fair  Hospital Course: 71 years old black female with PMH of breast and Pancreatic cancers with surgeries, HTN, GERD, Protein calorie malnutrition had dizziness, nausea and vomiting along with elevated blood pressure. She responded to IV fluids, Zofran and holding most of the medications except BP medications. She had CT scan of head, CXR and abdominal x-ray, all without acute abnormality. Her Lipase was also normal.  She was treated with IV azithromycin for 2 days and switched over to PO dose. She will eat soft diet as tolerated for few days then advance diet as tolerated. Her HCTZ was discontinued. She was discharged home with f/u by me in 1 week.  Consults: cardiology  Significant Diagnostic Studies: labs: CBC showed normal WBC and platelets counts. Hgb was stable at 10.7 gm. Potassium was 3.4 mmol/L. Protein was low at 5.5 gm and albumin was 3.0 gm.  EKG was normal sinus rhythm with antero-lateral ischemia.  CXR, abdominal x-ray and CT head were unremarkable.  Echocardiogram showed normal LV systolic function with moderate MR and TR.  Treatments: IV hydration and antibiotics: azithromycin  Discharge Exam: Blood pressure (!) 152/93, pulse 94, temperature 98.5 F (36.9 C), temperature source Oral, resp. rate 17, height 5\' 7"  (1.702 m), weight 62.6 kg, SpO2 100 %. General  appearance: alert, cooperative and appears stated age. Head: Normocephalic, atraumatic. Eyes: Brown eyes, pink conjunctiva, corneas clear.   Neck: No adenopathy, no carotid bruit, no JVD, supple, symmetrical, trachea midline and thyroid not enlarged. Resp: Clear to auscultation bilaterally. Cardio: Regular rate and rhythm, S1, S2 normal, II/VI systolic murmur, no click, rub or gallop. GI: Soft, non-tender; bowel sounds normal; no organomegaly. Extremities: No edema, cyanosis or clubbing. Skin: Warm and dry.  Neurologic: Alert and oriented X 3, normal strength and tone. Normal coordination and slow gait with walker.  Disposition: Discharge disposition: 01-Home or Self Care        Allergies as of 05/17/2021   No Known Allergies      Medication List     STOP taking these medications    aspirin EC 81 MG tablet   calcium carbonate 1250 (500 Ca) MG tablet Commonly known as: OS-CAL - dosed in mg of elemental calcium   fluocinonide cream 0.05 % Commonly known as: LIDEX   gabapentin 300 MG capsule Commonly known as: NEURONTIN   hydrochlorothiazide 12.5 MG capsule Commonly known as: MICROZIDE   meloxicam 7.5 MG tablet Commonly known as: MOBIC   metoprolol tartrate 25 MG tablet Commonly known as: LOPRESSOR   potassium chloride 10 MEQ tablet Commonly known as: KLOR-CON   solifenacin 10 MG tablet Commonly known as: VESICARE       TAKE these medications    acetaminophen 500 MG tablet Commonly known as: TYLENOL Take 500 mg by mouth every 6 (six) hours as needed. pain   amLODipine 5 MG tablet Commonly known as: NORVASC Take 1 tablet (5 mg total) by mouth daily.  azithromycin 500 MG tablet Commonly known as: ZITHROMAX Take one tablet on Saturday AM. Start taking on: May 18, 2021   Cholecalciferol 50 MCG (2000 UT) Caps Take 1 capsule by mouth daily.   cycloSPORINE 0.05 % ophthalmic emulsion Commonly known as: RESTASIS Place 1 drop into both eyes  daily.   diphenhydrAMINE 25 mg capsule Commonly known as: BENADRYL Take 25 mg by mouth 2 (two) times daily as needed.   docusate calcium 240 MG capsule Commonly known as: SURFAK Take 240 mg by mouth daily.   DULoxetine 30 MG capsule Commonly known as: CYMBALTA Take 30 mg by mouth daily.   ferrous sulfate 325 (65 FE) MG tablet Take 325 mg by mouth daily with breakfast. What changed: Another medication with the same name was removed. Continue taking this medication, and follow the directions you see here.   fluticasone 50 MCG/ACT nasal spray Commonly known as: FLONASE Place 1 spray into the nose daily as needed. allergies   letrozole 2.5 MG tablet Commonly known as: FEMARA Take 2.5 mg by mouth daily.   lidocaine-prilocaine cream Commonly known as: EMLA Apply 1 application topically daily as needed (For rash).   lipase/protease/amylase 12000-38000 units Cpep capsule Commonly known as: CREON Take 12,000 Units by mouth 3 (three) times daily before meals.   lisinopril 10 MG tablet Commonly known as: ZESTRIL Take 10 mg by mouth daily.   meclizine 12.5 MG tablet Commonly known as: ANTIVERT Take 1 tablet (12.5 mg total) by mouth 3 (three) times daily.   methocarbamol 750 MG tablet Commonly known as: ROBAXIN Take 750 mg by mouth in the morning and at bedtime.   omeprazole 40 MG capsule Commonly known as: PRILOSEC Take 40 mg by mouth daily.   ondansetron 4 MG tablet Commonly known as: ZOFRAN Take 1 tablet (4 mg total) by mouth every 8 (eight) hours as needed for nausea.   oxybutynin 5 MG tablet Commonly known as: DITROPAN Take 5 mg by mouth daily.        Follow-up Information     Dixie Dials, MD. Schedule an appointment as soon as possible for a visit in 1 week(s).   Specialty: Cardiology Contact information: [place] [address] [phone]                 Time spent: Review of old chart, current chart, lab, x-ray, cardiac  tests and discussion with patient over 60 minutes.  Signed: Birdie Riddle 05/17/2021, 2:12 PM

## 2022-12-02 ENCOUNTER — Encounter (HOSPITAL_COMMUNITY): Payer: Self-pay

## 2022-12-02 ENCOUNTER — Inpatient Hospital Stay (HOSPITAL_COMMUNITY)
Admission: AD | Admit: 2022-12-02 | Discharge: 2022-12-05 | DRG: 069 | Disposition: A | Discharge status: Home / Self Care | Payer: Medicare HMO | Source: Ambulatory Visit | Attending: Cardiovascular Disease | Admitting: Cardiovascular Disease

## 2022-12-02 ENCOUNTER — Inpatient Hospital Stay (HOSPITAL_COMMUNITY): Payer: Medicare HMO

## 2022-12-02 ENCOUNTER — Other Ambulatory Visit: Payer: Self-pay

## 2022-12-02 DIAGNOSIS — Z6822 Body mass index (BMI) 22.0-22.9, adult: Secondary | ICD-10-CM | POA: Diagnosis not present

## 2022-12-02 DIAGNOSIS — Z91040 Latex allergy status: Secondary | ICD-10-CM | POA: Diagnosis not present

## 2022-12-02 DIAGNOSIS — K5901 Slow transit constipation: Secondary | ICD-10-CM | POA: Diagnosis present

## 2022-12-02 DIAGNOSIS — R531 Weakness: Principal | ICD-10-CM

## 2022-12-02 DIAGNOSIS — K2100 Gastro-esophageal reflux disease with esophagitis, without bleeding: Secondary | ICD-10-CM | POA: Diagnosis present

## 2022-12-02 DIAGNOSIS — E441 Mild protein-calorie malnutrition: Secondary | ICD-10-CM | POA: Diagnosis present

## 2022-12-02 DIAGNOSIS — Z91041 Radiographic dye allergy status: Secondary | ICD-10-CM

## 2022-12-02 DIAGNOSIS — I10 Essential (primary) hypertension: Secondary | ICD-10-CM | POA: Diagnosis present

## 2022-12-02 DIAGNOSIS — Z79899 Other long term (current) drug therapy: Secondary | ICD-10-CM | POA: Diagnosis not present

## 2022-12-02 DIAGNOSIS — G500 Trigeminal neuralgia: Secondary | ICD-10-CM | POA: Diagnosis present

## 2022-12-02 DIAGNOSIS — Z9012 Acquired absence of left breast and nipple: Secondary | ICD-10-CM | POA: Diagnosis not present

## 2022-12-02 DIAGNOSIS — Z923 Personal history of irradiation: Secondary | ICD-10-CM

## 2022-12-02 DIAGNOSIS — Z91148 Patient's other noncompliance with medication regimen for other reason: Secondary | ICD-10-CM | POA: Diagnosis not present

## 2022-12-02 DIAGNOSIS — Z9884 Bariatric surgery status: Secondary | ICD-10-CM | POA: Diagnosis not present

## 2022-12-02 DIAGNOSIS — Z8507 Personal history of malignant neoplasm of pancreas: Secondary | ICD-10-CM

## 2022-12-02 DIAGNOSIS — G459 Transient cerebral ischemic attack, unspecified: Secondary | ICD-10-CM | POA: Diagnosis present

## 2022-12-02 DIAGNOSIS — Z79811 Long term (current) use of aromatase inhibitors: Secondary | ICD-10-CM

## 2022-12-02 DIAGNOSIS — R638 Other symptoms and signs concerning food and fluid intake: Secondary | ICD-10-CM | POA: Diagnosis not present

## 2022-12-02 DIAGNOSIS — R112 Nausea with vomiting, unspecified: Secondary | ICD-10-CM | POA: Diagnosis not present

## 2022-12-02 DIAGNOSIS — I161 Hypertensive emergency: Secondary | ICD-10-CM | POA: Diagnosis present

## 2022-12-02 DIAGNOSIS — Z90411 Acquired partial absence of pancreas: Secondary | ICD-10-CM

## 2022-12-02 DIAGNOSIS — Z853 Personal history of malignant neoplasm of breast: Secondary | ICD-10-CM | POA: Diagnosis not present

## 2022-12-02 DIAGNOSIS — R7303 Prediabetes: Secondary | ICD-10-CM | POA: Diagnosis present

## 2022-12-02 DIAGNOSIS — Z9221 Personal history of antineoplastic chemotherapy: Secondary | ICD-10-CM | POA: Diagnosis not present

## 2022-12-02 DIAGNOSIS — Z8673 Personal history of transient ischemic attack (TIA), and cerebral infarction without residual deficits: Secondary | ICD-10-CM | POA: Diagnosis not present

## 2022-12-02 DIAGNOSIS — Z9049 Acquired absence of other specified parts of digestive tract: Secondary | ICD-10-CM | POA: Diagnosis not present

## 2022-12-02 DIAGNOSIS — Z90410 Acquired total absence of pancreas: Secondary | ICD-10-CM

## 2022-12-02 DIAGNOSIS — E785 Hyperlipidemia, unspecified: Secondary | ICD-10-CM | POA: Diagnosis present

## 2022-12-02 DIAGNOSIS — Z91048 Other nonmedicinal substance allergy status: Secondary | ICD-10-CM

## 2022-12-02 LAB — CBC WITH DIFFERENTIAL/PLATELET
Abs Immature Granulocytes: 0.01 10*3/uL (ref 0.00–0.07)
Basophils Absolute: 0 10*3/uL (ref 0.0–0.1)
Basophils Relative: 1 %
Eosinophils Absolute: 0.2 10*3/uL (ref 0.0–0.5)
Eosinophils Relative: 4 %
HCT: 37.1 % (ref 36.0–46.0)
Hemoglobin: 11.5 g/dL — ABNORMAL LOW (ref 12.0–15.0)
Immature Granulocytes: 0 %
Lymphocytes Relative: 27 %
Lymphs Abs: 1.1 10*3/uL (ref 0.7–4.0)
MCH: 28 pg (ref 26.0–34.0)
MCHC: 31 g/dL (ref 30.0–36.0)
MCV: 90.3 fL (ref 80.0–100.0)
Monocytes Absolute: 0.5 10*3/uL (ref 0.1–1.0)
Monocytes Relative: 12 %
Neutro Abs: 2.2 10*3/uL (ref 1.7–7.7)
Neutrophils Relative %: 56 %
Platelets: 219 10*3/uL (ref 150–400)
RBC: 4.11 MIL/uL (ref 3.87–5.11)
RDW: 13.2 % (ref 11.5–15.5)
WBC: 3.9 10*3/uL — ABNORMAL LOW (ref 4.0–10.5)
nRBC: 0 % (ref 0.0–0.2)

## 2022-12-02 LAB — COMPREHENSIVE METABOLIC PANEL
ALT: 13 U/L (ref 0–44)
AST: 18 U/L (ref 15–41)
Albumin: 3.2 g/dL — ABNORMAL LOW (ref 3.5–5.0)
Alkaline Phosphatase: 24 U/L — ABNORMAL LOW (ref 38–126)
Anion gap: 11 (ref 5–15)
BUN: 14 mg/dL (ref 8–23)
CO2: 20 mmol/L — ABNORMAL LOW (ref 22–32)
Calcium: 8.1 mg/dL — ABNORMAL LOW (ref 8.9–10.3)
Chloride: 107 mmol/L (ref 98–111)
Creatinine, Ser: 0.76 mg/dL (ref 0.44–1.00)
GFR, Estimated: >60 mL/min (ref >60)
Glucose, Bld: 103 mg/dL — ABNORMAL HIGH (ref 70–99)
Potassium: 3.5 mmol/L (ref 3.5–5.1)
Sodium: 138 mmol/L (ref 135–145)
Total Bilirubin: 0.8 mg/dL (ref 0.3–1.2)
Total Protein: 5.8 g/dL — ABNORMAL LOW (ref 6.5–8.1)

## 2022-12-02 LAB — ECHOCARDIOGRAM COMPLETE
AR max vel: 2.73 cm2
AV Peak grad: 6.9 mmHg
Ao pk vel: 1.31 m/s
Area-P 1/2: 4.63 cm2
S' Lateral: 3.6 cm

## 2022-12-02 LAB — HEMOGLOBIN A1C
Hgb A1c MFr Bld: 6.3 % — ABNORMAL HIGH (ref 4.8–5.6)
Mean Plasma Glucose: 134.11 mg/dL

## 2022-12-02 MED ORDER — CYCLOSPORINE 0.05 % OP EMUL
1.0000 [drp] | Freq: Every day | OPHTHALMIC | Status: DC
  Administered 2022-12-02 – 2022-12-04 (×3): 1 [drp] via OPHTHALMIC
  Filled 2022-12-02 (×4): qty 30

## 2022-12-02 MED ORDER — DULOXETINE HCL 30 MG PO CPEP
30.0000 mg | ORAL_CAPSULE | Freq: Every day | ORAL | Status: DC
  Administered 2022-12-03 – 2022-12-05 (×3): 30 mg via ORAL
  Filled 2022-12-02 (×3): qty 1

## 2022-12-02 MED ORDER — ONDANSETRON HCL 4 MG PO TABS: 4.0000 mg | ORAL_TABLET | Freq: Four times a day (QID) | ORAL | Status: DC | PRN

## 2022-12-02 MED ORDER — AMLODIPINE BESYLATE 5 MG PO TABS
5.0000 mg | ORAL_TABLET | Freq: Every day | ORAL | Status: DC
  Administered 2022-12-02 – 2022-12-05 (×4): 5 mg via ORAL
  Filled 2022-12-02 (×4): qty 1

## 2022-12-02 MED ORDER — LOSARTAN POTASSIUM 50 MG PO TABS
100.0000 mg | ORAL_TABLET | Freq: Every day | ORAL | Status: DC
  Administered 2022-12-03 – 2022-12-05 (×3): 100 mg via ORAL
  Filled 2022-12-02 (×3): qty 2

## 2022-12-02 MED ORDER — SODIUM CHLORIDE 0.9 % IV SOLN: 250.0000 mL | INTRAVENOUS | Status: DC | PRN

## 2022-12-02 MED ORDER — LISINOPRIL 10 MG PO TABS: 10.0000 mg | ORAL_TABLET | Freq: Every day | ORAL | Status: DC

## 2022-12-02 MED ORDER — SODIUM CHLORIDE 0.9% FLUSH: 10.0000 mL | INTRAVENOUS | Status: DC | PRN

## 2022-12-02 MED ORDER — METHOCARBAMOL 500 MG PO TABS
750.0000 mg | ORAL_TABLET | Freq: Every day | ORAL | Status: DC
  Administered 2022-12-03 – 2022-12-04 (×2): 750 mg via ORAL
  Filled 2022-12-02 (×2): qty 2

## 2022-12-02 MED ORDER — HEPARIN SODIUM (PORCINE) 5000 UNIT/ML IJ SOLN
5000.0000 [IU] | Freq: Three times a day (TID) | INTRAMUSCULAR | Status: AC
  Administered 2022-12-02 – 2022-12-03 (×5): 5000 [IU] via SUBCUTANEOUS
  Filled 2022-12-02 (×5): qty 1

## 2022-12-02 MED ORDER — CHLORHEXIDINE GLUCONATE CLOTH 2 % EX PADS
6.0000 | MEDICATED_PAD | Freq: Every day | CUTANEOUS | Status: DC
  Administered 2022-12-02 – 2022-12-05 (×4): 6 via TOPICAL

## 2022-12-02 MED ORDER — HYDRALAZINE HCL 20 MG/ML IJ SOLN
10.0000 mg | Freq: Three times a day (TID) | INTRAMUSCULAR | Status: DC
  Administered 2022-12-02 – 2022-12-04 (×7): 10 mg via INTRAVENOUS
  Filled 2022-12-02 (×7): qty 1

## 2022-12-02 MED ORDER — SODIUM CHLORIDE 0.9% FLUSH
3.0000 mL | INTRAVENOUS | Status: DC | PRN
  Administered 2022-12-03 – 2022-12-05 (×2): 3 mL via INTRAVENOUS

## 2022-12-02 MED ORDER — ACETAMINOPHEN 325 MG PO TABS
650.0000 mg | ORAL_TABLET | Freq: Four times a day (QID) | ORAL | Status: DC | PRN
  Administered 2022-12-02 – 2022-12-03 (×3): 650 mg via ORAL
  Filled 2022-12-02 (×3): qty 2

## 2022-12-02 MED ORDER — ONDANSETRON HCL 4 MG/2ML IJ SOLN
4.0000 mg | Freq: Four times a day (QID) | INTRAMUSCULAR | Status: DC | PRN
  Administered 2022-12-03: 4 mg via INTRAVENOUS
  Filled 2022-12-02: qty 2

## 2022-12-02 MED ORDER — PANTOPRAZOLE SODIUM 40 MG PO TBEC
40.0000 mg | DELAYED_RELEASE_TABLET | Freq: Every day | ORAL | Status: DC
  Administered 2022-12-02 – 2022-12-05 (×4): 40 mg via ORAL
  Filled 2022-12-02 (×4): qty 1

## 2022-12-02 MED ORDER — OXYBUTYNIN CHLORIDE 5 MG PO TABS
5.0000 mg | ORAL_TABLET | Freq: Every day | ORAL | Status: DC
  Administered 2022-12-02 – 2022-12-04 (×3): 5 mg via ORAL
  Filled 2022-12-02 (×3): qty 1

## 2022-12-02 MED ORDER — SODIUM CHLORIDE 0.9% FLUSH
3.0000 mL | Freq: Two times a day (BID) | INTRAVENOUS | Status: DC
  Administered 2022-12-02 – 2022-12-04 (×4): 3 mL via INTRAVENOUS

## 2022-12-02 MED ORDER — FLUTICASONE PROPIONATE 50 MCG/ACT NA SUSP: 1.0000 | Freq: Every day | NASAL | Status: DC | PRN

## 2022-12-02 MED ORDER — PANCRELIPASE (LIP-PROT-AMYL) 12000-38000 UNITS PO CPEP
12000.0000 [IU] | ORAL_CAPSULE | Freq: Three times a day (TID) | ORAL | Status: DC
  Administered 2022-12-03 – 2022-12-05 (×5): 12000 [IU] via ORAL
  Filled 2022-12-02 (×7): qty 1

## 2022-12-02 MED ORDER — MECLIZINE HCL 25 MG PO TABS
12.5000 mg | ORAL_TABLET | Freq: Three times a day (TID) | ORAL | Status: DC
  Administered 2022-12-03 – 2022-12-05 (×9): 12.5 mg via ORAL
  Filled 2022-12-02 (×9): qty 1

## 2022-12-02 MED ORDER — SODIUM CHLORIDE 0.9% FLUSH
10.0000 mL | Freq: Two times a day (BID) | INTRAVENOUS | Status: DC
  Administered 2022-12-02 – 2022-12-05 (×5): 10 mL

## 2022-12-02 NOTE — H&P (Signed)
Referring Physician:  VIRIGINIA CHAMBLESS is an 72 y.o. female.                       Chief Complaint: Weakness and elevated blood pressure  HPI: 71 years old black female with PMH of breast and pancreatic cancer, s/p surgeries for cancer, GERD, protein calorie malnutrition, chronic nausea and vomiting has elevated blood pressure from not able to take BP medications. She also has right sided weakness ongoing for over 1 month.  Past Medical History:  Diagnosis Date   Bilateral leg edema 11/2015   Cancer [facility])    breast & pancreatic   GERD (gastroesophageal reflux disease)    Hypertension    Trigeminal neuralgia       Past Surgical History:  Procedure Laterality Date   ABDOMINAL HYSTERECTOMY     BREAST LUMPECTOMY     CARDIAC CATHETERIZATION N/A 12/31/2015   Procedure: Left Heart Cath and Coronary Angiography;  Surgeon: Orpah Cobb, MD;  Location: [facility] INVASIVE CV LAB;  Service: Cardiovascular;  Laterality: N/A;   CHOLECYSTECTOMY     FACIAL NERVE SURGERY      No family history on file. Social History:  reports that she has never smoked. She has never used smokeless tobacco. She reports that she does not drink alcohol and does not use drugs.  Allergies: No Known Allergies  Medications Prior to Admission  Medication Sig Dispense Refill   acetaminophen (TYLENOL) 500 MG tablet Take 500 mg by mouth every 6 (six) hours as needed. pain     amLODipine (NORVASC) 5 MG tablet Take 1 tablet (5 mg total) by mouth daily. 30 tablet 0   azithromycin (ZITHROMAX) 500 MG tablet Take one tablet on Saturday AM. 1 tablet 0   Cholecalciferol 50 MCG (2000 UT) CAPS Take 1 capsule by mouth daily.     cycloSPORINE (RESTASIS) 0.05 % ophthalmic emulsion Place 1 drop into both eyes daily.     diphenhydrAMINE (BENADRYL) 25 mg capsule Take 25 mg by mouth 2 (two) times daily as needed.     docusate calcium (SURFAK) 240 MG capsule Take 240 mg by mouth daily.     DULoxetine (CYMBALTA) 30 MG capsule Take 30 mg by mouth  daily.     ferrous sulfate 325 (65 FE) MG tablet Take 325 mg by mouth daily with breakfast.     fluticasone (FLONASE) 50 MCG/ACT nasal spray Place 1 spray into the nose daily as needed. allergies     letrozole (FEMARA) 2.5 MG tablet Take 2.5 mg by mouth daily.     lidocaine-prilocaine (EMLA) cream Apply 1 application topically daily as needed (For rash).      lipase/protease/amylase (CREON) 12000 units CPEP capsule Take 12,000 Units by mouth 3 (three) times daily before meals.     lisinopril (ZESTRIL) 10 MG tablet Take 10 mg by mouth daily.     meclizine (ANTIVERT) 12.5 MG tablet Take 1 tablet (12.5 mg total) by mouth 3 (three) times daily. 90 tablet 3   methocarbamol (ROBAXIN) 750 MG tablet Take 750 mg by mouth in the morning and at bedtime.     omeprazole (PRILOSEC) 40 MG capsule Take 40 mg by mouth daily.     ondansetron (ZOFRAN) 4 MG tablet Take 1 tablet (4 mg total) by mouth every 8 (eight) hours as needed for nausea. 90 tablet 1   oxybutynin (DITROPAN) 5 MG tablet Take 5 mg by mouth daily.      No results found for this  or any previous visit (from the past 48 hour(s)). No results found.  Review Of Systems Constitutional: No fever, chills, chronic weight loss. Eyes: No vision change, wears glasses. No discharge or pain. Ears: No hearing loss, No tinnitus. Respiratory: No asthma, COPD, pneumonias. positive shortness of breath. No hemoptysis. Cardiovascular: No chest pain, palpitation, leg edema. Gastrointestinal: Positive nausea, vomiting, diarrhea, constipation. No GI bleed. No hepatitis. Genitourinary: No dysuria, hematuria, kidney stone. No incontinance. Neurological: Positive headache, positive stroke, no seizures.  Psychiatry: No psych facility admission for anxiety, depression, suicide. No detox. Skin: No rash. Musculoskeletal: Positive joint pain, no fibromyalgia. No neck pain, back pain. Lymphadenopathy: No lymphadenopathy. Hematology: No anemia or easy bruising.   Blood  pressure (!) 182/96, pulse (!) 57, temperature 98.7 F (37.1 C), temperature source Oral, resp. rate 17, SpO2 100 %. There is no height or weight on file to calculate BMI. General appearance: alert, cooperative, appears stated age and no distress Head: Normocephalic, atraumatic. Eyes: Brown eyes, pink conjunctiva, corneas clear. PERRL, EOM's intact. Neck: No adenopathy, no carotid bruit, no JVD, supple, symmetrical, trachea midline and thyroid not enlarged. Resp: Clear to auscultation bilaterally. Cardio: Regular rate and rhythm, S1, S2 normal, II/VI systolic murmur, no click, rub or gallop GI: Soft, non-tender; bowel sounds normal; no organomegaly. Extremities: No edema, cyanosis or clubbing. Skin: Warm and dry.  Neurologic: Alert and oriented X 3, Mild right sided weakness. Normal coordination and slow gait.  Assessment/Plan Right sided weakness r/o stroke Uncontrolled hypertension Chronic nausea and vomiting S/P breast and pancreatic cancer Protein calorie malnutrition.  Plan: Admit MRI brain r/o stroke, possible neurology consult. IV hydralazine. Continue home medications.   Time spent: Review of old records, Lab, x-rays, EKG, other cardiac tests, examination, discussion with patient/Family/Nurse over 70 minutes.  Ricki Rodriguez, MD  12/02/2022, 2:57 PM

## 2022-12-02 NOTE — Progress Notes (Signed)
Echocardiogram 2D Echocardiogram has been performed.  Lucendia Herrlich 12/02/2022, 4:11 PM

## 2022-12-03 ENCOUNTER — Inpatient Hospital Stay (HOSPITAL_COMMUNITY): Payer: Medicare HMO

## 2022-12-03 DIAGNOSIS — I10 Essential (primary) hypertension: Secondary | ICD-10-CM | POA: Diagnosis not present

## 2022-12-03 DIAGNOSIS — R638 Other symptoms and signs concerning food and fluid intake: Secondary | ICD-10-CM

## 2022-12-03 DIAGNOSIS — R112 Nausea with vomiting, unspecified: Secondary | ICD-10-CM

## 2022-12-03 DIAGNOSIS — Z90410 Acquired total absence of pancreas: Secondary | ICD-10-CM

## 2022-12-03 DIAGNOSIS — R531 Weakness: Secondary | ICD-10-CM | POA: Diagnosis not present

## 2022-12-03 LAB — BASIC METABOLIC PANEL
Anion gap: 6 (ref 5–15)
BUN: 12 mg/dL (ref 8–23)
CO2: 23 mmol/L (ref 22–32)
Calcium: 7.9 mg/dL — ABNORMAL LOW (ref 8.9–10.3)
Chloride: 108 mmol/L (ref 98–111)
Creatinine, Ser: 0.88 mg/dL (ref 0.44–1.00)
GFR, Estimated: >60 mL/min (ref >60)
Glucose, Bld: 134 mg/dL — ABNORMAL HIGH (ref 70–99)
Potassium: 3.2 mmol/L — ABNORMAL LOW (ref 3.5–5.1)
Sodium: 137 mmol/L (ref 135–145)

## 2022-12-03 MED ORDER — ASPIRIN 81 MG PO TBEC
81.0000 mg | DELAYED_RELEASE_TABLET | Freq: Every day | ORAL | Status: DC
  Administered 2022-12-03 – 2022-12-05 (×3): 81 mg via ORAL
  Filled 2022-12-03 (×3): qty 1

## 2022-12-03 MED ORDER — POTASSIUM CHLORIDE CRYS ER 10 MEQ PO TBCR
10.0000 meq | EXTENDED_RELEASE_TABLET | Freq: Every day | ORAL | Status: DC
  Administered 2022-12-03 – 2022-12-05 (×3): 10 meq via ORAL
  Filled 2022-12-03 (×3): qty 1

## 2022-12-03 MED ORDER — STROKE: EARLY STAGES OF RECOVERY BOOK
Freq: Once | Status: AC
  Filled 2022-12-03 (×2): qty 1

## 2022-12-03 MED ORDER — ROSUVASTATIN CALCIUM 20 MG PO TABS
20.0000 mg | ORAL_TABLET | Freq: Every day | ORAL | Status: DC
  Administered 2022-12-04 – 2022-12-05 (×2): 20 mg via ORAL
  Filled 2022-12-03 (×2): qty 1

## 2022-12-03 MED ORDER — DIPHENHYDRAMINE HCL 50 MG/ML IJ SOLN: 50.0000 mg | Freq: Once | INTRAMUSCULAR | Status: AC

## 2022-12-03 MED ORDER — POTASSIUM CHLORIDE ER 8 MEQ PO CPCR: 8.0000 meq | ORAL_CAPSULE | Freq: Every day | ORAL | Status: DC

## 2022-12-03 MED ORDER — PREDNISONE 20 MG PO TABS
50.0000 mg | ORAL_TABLET | Freq: Four times a day (QID) | ORAL | Status: AC
  Administered 2022-12-03 – 2022-12-04 (×3): 50 mg via ORAL
  Filled 2022-12-03 (×3): qty 1

## 2022-12-03 MED ORDER — DIPHENHYDRAMINE HCL 25 MG PO CAPS
50.0000 mg | ORAL_CAPSULE | Freq: Once | ORAL | Status: AC
  Administered 2022-12-04: 50 mg via ORAL
  Filled 2022-12-03: qty 2

## 2022-12-03 NOTE — Progress Notes (Signed)
Ref: Orpah Cobb, MD   Subjective:  Nausea continues. BP normalizes with IV hydralazine use. Discussed care with GI for nausea, vomiting, Pancreatic cancer with whipple procedure and Neurology for right sided weakness without recent stroke and with small vessel disease.  Objective:  Vital Signs in the last 24 hours: Temp:  [97.8 F (36.6 C)-98.7 F (37.1 C)] 97.8 F (36.6 C) (05/08 0400) Pulse Rate:  [53-69] 69 (05/08 0400) Cardiac Rhythm: Normal sinus rhythm (05/08 0700) Resp:  [17-18] 18 (05/08 0400) BP: (127-182)/(61-96) 127/61 (05/08 0400) SpO2:  [99 %-100 %] 100 % (05/08 0400)  Physical Exam: BP Readings from Last 1 Encounters:  12/03/22 127/61     Wt Readings from Last 1 Encounters:  05/17/21 62.6 kg    Weight change:  There is no height or weight on file to calculate BMI. HEENT: [place]/AT, Eyes-Brown, Conjunctiva-Pink, Sclera-Non-icteric Neck: No JVD, No bruit, Trachea midline. Lungs:  Clear, Bilateral. Cardiac:  Regular rhythm, normal S1 and S2, no S3. II/VI systolic murmur. Abdomen:  Soft, non-tender. BS present. Extremities:  No edema present. No cyanosis. No clubbing. CNS: AxOx3, Cranial nerves grossly intact, moves all 4 extremities.  Skin: Warm and dry.   Intake/Output from previous day: 05/07 0701 - 05/08 0700 In: 10 [I.V.:10] Out: -     Lab Results: BMET    Component Value Date/Time   NA 137 12/03/2022 0430   NA 138 12/02/2022 1537   NA 143 05/16/2021 0001   K 3.2 (L) 12/03/2022 0430   K 3.5 12/02/2022 1537   K 3.4 (L) 05/16/2021 0001   CL 108 12/03/2022 0430   CL 107 12/02/2022 1537   CL 110 05/16/2021 0001   CO2 23 12/03/2022 0430   CO2 20 (L) 12/02/2022 1537   CO2 26 05/16/2021 0001   GLUCOSE 134 (H) 12/03/2022 0430   GLUCOSE 103 (H) 12/02/2022 1537   GLUCOSE 92 05/16/2021 0001   BUN 12 12/03/2022 0430   BUN 14 12/02/2022 1537   BUN 16 05/16/2021 0001   CREATININE 0.88 12/03/2022 0430   CREATININE 0.76 12/02/2022 1537   CREATININE  0.93 05/16/2021 0001   CALCIUM 7.9 (L) 12/03/2022 0430   CALCIUM 8.1 (L) 12/02/2022 1537   CALCIUM 8.8 (L) 05/16/2021 0001   GFRNONAA >60 12/03/2022 0430   GFRNONAA >60 12/02/2022 1537   GFRNONAA >60 05/16/2021 0001   GFRAA 52 (L) 09/20/2019 0540   GFRAA 55 (L) 09/19/2019 1839   GFRAA >60 02/26/2017 0412   CBC    Component Value Date/Time   WBC 3.9 (L) 12/02/2022 1537   RBC 4.11 12/02/2022 1537   HGB 11.5 (L) 12/02/2022 1537   HGB 12.0 08/31/2007 1136   HCT 37.1 12/02/2022 1537   HCT 35.8 08/31/2007 1136   PLT 219 12/02/2022 1537   PLT 247 08/31/2007 1136   MCV 90.3 12/02/2022 1537   MCV 83.4 08/31/2007 1136   MCH 28.0 12/02/2022 1537   MCHC 31.0 12/02/2022 1537   RDW 13.2 12/02/2022 1537   RDW 14.9 (H) 08/31/2007 1136   LYMPHSABS 1.1 12/02/2022 1537   LYMPHSABS 1.2 08/31/2007 1136   MONOABS 0.5 12/02/2022 1537   MONOABS 0.5 08/31/2007 1136   EOSABS 0.2 12/02/2022 1537   EOSABS 0.2 08/31/2007 1136   BASOSABS 0.0 12/02/2022 1537   BASOSABS 0.0 08/31/2007 1136   HEPATIC Function Panel Recent Labs    12/02/22 1537  PROT 5.8*  ALBUMIN 3.2*  AST 18  ALT 13  ALKPHOS 24*   HEMOGLOBIN A1C Lab Results  Component Value Date   MPG 134.11 12/02/2022   CARDIAC ENZYMES Lab Results  Component Value Date   TROPONINI <0.03 02/26/2017   TROPONINI <0.03 02/25/2017   TROPONINI <0.03 02/25/2017   BNP No results for input(s): "PROBNP" in the last 8760 hours. TSH No results for input(s): "TSH" in the last 8760 hours. CHOLESTEROL No results for input(s): "CHOL" in the last 8760 hours.  Scheduled Meds:  amLODipine  5 mg Oral Daily   Chlorhexidine Gluconate Cloth  6 each Topical Daily   cycloSPORINE  1 drop Both Eyes Daily   DULoxetine  30 mg Oral Daily   heparin  5,000 Units Subcutaneous Q8H   hydrALAZINE  10 mg Intravenous Q8H   lipase/protease/amylase  12,000 Units Oral TID AC   losartan  100 mg Oral Daily   meclizine  12.5 mg Oral TID   methocarbamol  750 mg Oral  QHS   oxybutynin  5 mg Oral QHS   pantoprazole  40 mg Oral Daily   sodium chloride flush  10-40 mL Intracatheter Q12H   sodium chloride flush  3 mL Intravenous Q12H   Continuous Infusions:  sodium chloride     PRN Meds:.sodium chloride, acetaminophen, fluticasone, ondansetron **OR** ondansetron (ZOFRAN) IV, sodium chloride flush, sodium chloride flush  Assessment/Plan: Right sided weakness S/P old stroke Uncontrolled hypertension, improving with IV medication Chronic nausea and vomiting S/P left breast and pancreatic cancer Protein calorie malnutrition.  Plan: GI and Neurology consults.   LOS: 1 day   Time spent including chart review, lab review, examination, discussion with patient/GI/Neurology/Nurse : 30 min   Orpah Cobb  MD  12/03/2022, 9:12 AM

## 2022-12-03 NOTE — Plan of Care (Signed)
  Problem: Education: Goal: Knowledge of General Education information will improve Description Including pain rating scale, medication(s)/side effects and non-pharmacologic comfort measures Outcome: Progressing   Problem: Health Behavior/Discharge Planning: Goal: Ability to manage health-related needs will improve Outcome: Progressing   

## 2022-12-03 NOTE — Evaluation (Signed)
Occupational Therapy Evaluation Patient Details Name: Rebekah Steele MRN: [id number] DOB: Jan 18, 1951 Today's Date: 12/03/2022   History of Present Illness Patient is a 72 year old female who presented with nausea and vomitting with severe hypertension with persistent right side weakness and numbness. Patient was admitted with right sided weakness. 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 PMH: L breast and pancreatic cancer, HTN, vertigo,   Clinical Impression   Patient is a 72 year old female who was admitted for above. Patient was living at home with husband with independence with ADLs and occasional cane use. Patient currently is min guard to min A for standing balance with patient noted to reach out for doorways and objects for balance. Patient declined to use RW. Patient reported being dizzy with BP WNL. Patient was noted to have decreased functional activity tolerance, decreased endurance, decreased standing balance, decreased safety awareness, and decreased knowledge of AD/AE impacting participation in ADLs. Anticipate patient will progress to not need further OT services in next level of care. Patient would continue to benefit from skilled OT services at this time while admitted and after d/c to address noted deficits in order to improve overall safety and independence in ADLs.       Recommendations for follow up therapy are one component of a multi-disciplinary discharge planning process, led by the attending physician.  Recommendations may be updated based on patient status, additional functional criteria and insurance authorization.   Assistance Recommended at Discharge Frequent or constant Supervision/Assistance  Patient can return home with the following A little help with walking and/or transfers;Assistance with cooking/housework;Direct supervision/assist for medications management;Assist for transportation;Help with stairs or ramp for entrance;Direct supervision/assist for  financial management;A little help with bathing/dressing/bathroom    Functional Status Assessment  Patient has had a recent decline in their functional status and demonstrates the ability to make significant improvements in function in a reasonable and predictable amount of time.  Equipment Recommendations  None recommended by OT       Precautions / Restrictions Precautions Precautions: Fall Precaution Comments: h/o vertigo Restrictions Weight Bearing Restrictions: No      Mobility Bed Mobility Overal bed mobility: Needs Assistance Bed Mobility: Supine to Sit, Sit to Supine     Supine to sit: Supervision Sit to supine: Supervision           Balance Overall balance assessment: Mild deficits observed, not formally tested           ADL either performed or assessed with clinical judgement   ADL Overall ADL's : Needs assistance/impaired Eating/Feeding: Modified independent;Sitting Eating/Feeding Details (indicate cue type and reason): in bed., Grooming: Min guard;Standing;Wash/dry hands Grooming Details (indicate cue type and reason): at sink with some leaning on counter noted. patient noted to leave personal cane behind as well. Upper Body Bathing: Min guard;Sitting   Lower Body Bathing: Min guard;Sitting/lateral leans;Sit to/from stand Lower Body Bathing Details (indicate cue type and reason): patient is able to complete figure four positioning bilaterally. Upper Body Dressing : Set up;Sitting   Lower Body Dressing: Min guard;Set up;Sit to/from stand;Sitting/lateral leans   Toilet Transfer: Min guard;Ambulation Toilet Transfer Details (indicate cue type and reason): with cane with cues to keep it with her. patient noted to reach out to doorways and such as well with movement. patient reported she sometimes has to do this at home. Toileting- Architect and Hygiene: Min guard;Sit to/from stand Toileting - Clothing Manipulation Details (indicate cue type and  reason): for toileting hygiene and clothing management  with increased time and cues.             Vision Baseline Vision/History: 1 Wears glasses Additional Comments: reported dizziness after toileting while washing hands. patient uanble to report when dizziness started with patient having reported no when asked about dizziness up until toileting task.            Pertinent Vitals/Pain Pain Assessment Pain Assessment: Faces Faces Pain Scale: Hurts a little bit Pain Location: headache Pain Descriptors / Indicators: Headache Pain Intervention(s): Monitored during session     Hand Dominance Right   Extremity/Trunk Assessment Upper Extremity Assessment Upper Extremity Assessment: Overall WFL for tasks assessed (strength was even and WFL)   Lower Extremity Assessment Lower Extremity Assessment: Defer to PT evaluation   Cervical / Trunk Assessment Cervical / Trunk Assessment: Normal   Communication Communication Communication: No difficulties   Cognition Arousal/Alertness: Awake/alert Behavior During Therapy: WFL for tasks assessed/performed Overall Cognitive Status: Within Functional Limits for tasks assessed           General Comments  patient reported dizziness upon return to bed. patients BP was 162/79 mmhg MAP 101, HR 96 bpm. HR was noted to reach 118 bpm with mobility to bathroom and back. patient reported dizziness was less in the bed.            Home Living Family/patient expects to be discharged to:: Private residence Living Arrangements: Spouse/significant other;Children Available Help at Discharge: Family;Available 24 hours/day Type of Home: House Home Access: Stairs to enter [employer] of Steps: 4 Entrance Stairs-Rails: Left Home Layout: Two level;Able to live on main level with bedroom/bathroom Alternate Level Stairs-Number of Steps: chair lift   Bathroom Shower/Tub: Walk-in shower;Other (comment);Tub/shower unit (walk in down stairs and  tub shower upstairs)         Home Equipment: Rollator (4 wheels);Rolling Walker (2 wheels)          Prior Functioning/Environment Prior Level of Function : Independent/Modified Independent              OT Problem List: Decreased activity tolerance;Impaired balance (sitting and/or standing);Decreased coordination;Decreased safety awareness;Decreased knowledge of precautions;Decreased knowledge of use of DME or AE      OT Treatment/Interventions: Self-care/ADL training;Energy conservation;Therapeutic exercise;DME and/or AE instruction;Therapeutic activities;Patient/family education;Balance training    OT Goals(Current goals can be found in the care plan section) Acute Rehab OT Goals Patient Stated Goal: to get better OT Goal Formulation: With patient Time For Goal Achievement: 12/17/22 Potential to Achieve Goals: Fair  OT Frequency: Min 2X/week       AM-PAC OT "6 Clicks" Daily Activity     Outcome Measure Help from another person eating meals?: None Help from another person taking care of personal grooming?: A Little Help from another person toileting, which includes using toliet, bedpan, or urinal?: A Little Help from another person bathing (including washing, rinsing, drying)?: A Little Help from another person to put on and taking off regular upper body clothing?: A Little Help from another person to put on and taking off regular lower body clothing?: A Little 6 Click Score: 19   End of Session Equipment Utilized During Treatment: Gait belt;Other (comment) (personal cane) Nurse Communication: Other (comment) (patients c/o dizziness)  Activity Tolerance: Other (comment) (limited by dizziness) Patient left: in bed;with call bell/phone within reach;with bed alarm set  OT Visit Diagnosis: Unsteadiness on feet (R26.81);Other abnormalities of gait and mobility (R26.89);History of falling (Z91.81)  Time: 4540-9811 OT Time Calculation (min): 24 min Charges:   OT General Charges $OT Visit: 1 Visit OT Evaluation $OT Eval Low Complexity: 1 Low OT Treatments $Self Care/Home Management : 8-22 mins  Rosalio Loud, MS Acute Rehabilitation Department Office# [phone]   Selinda Flavin 12/03/2022, 3:34 PM

## 2022-12-03 NOTE — Plan of Care (Signed)

## 2022-12-03 NOTE — Progress Notes (Signed)
Called into patient's room, found to be nauseated and dry heaving.  Zofran given per prn orders.  Will continue to monitor.

## 2022-12-03 NOTE — Progress Notes (Signed)
Plan for EGD tomorrow - patient is currently receiving subcutaneous heparin for DVT prophylaxis - needs to stop 4 hours prior to procedure.  Stop date added for orders to end after 2200 dose tonight - will defer to the team to reorder DVT prophylaxis after procedure.  Thank you for allowing pharmacy to participate in this patient's care,  Sherron Monday, PharmD, BCCCP Clinical Pharmacist  Phone: [phone] 12/03/2022 11:35 AM  Please check AMION for all [facility] Pharmacy phone numbers After 10:00 PM, call Main Pharmacy [phone]

## 2022-12-03 NOTE — H&P (View-Only) (Signed)
     Consultation  Referring Provider:   Cardiology Primary Care Physician:  Kadakia, Ajay, MD Primary Gastroenterologist:  Dr. Mettu        Reason for Consultation:   Nausea and vomiting, inability to take p.o.         HPI:   Rebekah Steele is a 72 y.o. 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  Follows with [facility] oncology and Dr. Mettu for GI cancer clinic.  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 GI consulted due to nausea and vomiting with inability to take p.o.  08/21/17 Endoscopy normal esophagus. 2 cm hiatal hernia. Normal jejunum. Roux en Y gastrojej  08/23/21 CT chest stable 12/20/21 MRI abd, CT chest stable with NED  MRI at that time showed unremarkable liver, no bile duct dilation, normal spleen, status post Whipple normal GI tract no dilation or wall thickening.  No lymphadenopathy.  IV hydralazine normalize BP. MRI brainShows old right cerebellar infarct chronic small vessel ischemia  Echocardiogram unremarkable White blood cell count 3.9, hemoglobin 11.5, MCV 90.3 Potassium 3.2, glucose 134, calcium 7.9, alk phos 24, albumin 3.2, AST 18, ALT 13, total bilirubin 5.8.  No family at bedside during evaluation. Patient states for past 3 months she has been having nausea and vomiting after taking her medications. States has been able to eat and drink well but when taking her medications even looking at them will cause nausea. She will take her pills feels like they go down all the way and then will have nausea and vomit up clear liquid/bile.   Will have associated abdominal discomfort during that time relieved with vomiting. Patient denies reflux, was on Prilosec outpatient once daily but stopped 2 to 3 months ago. Denies dysphagia, odynophagia. Has bowel movement at least once daily occasionally twice daily, takes Creon for EPI which she states helped, denies melena or hematochezia.  Has had gray/milky colored stools last several weeks associate with nausea and vomiting. Denies fever chills, weight loss. Denies NSAID use, alcohol, drug use, smoking history.  Abnormal ED labs: Abnormal Labs Reviewed  COMPREHENSIVE METABOLIC PANEL - Abnormal; Notable for the following components:      Result Value   CO2 20 (*)    Glucose, Bld 103 (*)    Calcium 8.1 (*)    Total Protein 5.8 (*)    Albumin 3.2 (*)    Alkaline Phosphatase 24 (*)    All other components within normal limits  CBC WITH DIFFERENTIAL/PLATELET - Abnormal; Notable for the following components:   WBC 3.9 (*)    Hemoglobin 11.5 (*)    All other components within normal limits  HEMOGLOBIN A1C - Abnormal; Notable for the following components:   Hgb A1c MFr Bld 6.3 (*)    All other components within normal limits  BASIC METABOLIC PANEL - Abnormal; Notable for the following components:   Potassium 3.2 (*)    Glucose, Bld 134 (*)    Calcium 7.9 (*)    All other components within normal limits     Past Medical History:  Diagnosis Date   Bilateral leg edema 11/2015   Cancer (HCC)      breast & pancreatic   GERD (gastroesophageal reflux disease)    Hypertension    Trigeminal neuralgia     Surgical History:  She  has a past surgical history that includes Cholecystectomy; Abdominal hysterectomy; Breast lumpectomy; Facial nerve surgery; and Cardiac catheterization (N/A, 12/31/2015). Family History:  Her family history is not on file. Social History:   reports that she has never smoked. She has never used smokeless tobacco. She reports that she does not drink alcohol and  does not use drugs.  Prior to Admission medications   Medication Sig Start Date End Date Taking? Authorizing Provider  acetaminophen (TYLENOL) 500 MG tablet Take 500 mg by mouth every 6 (six) hours as needed. pain   Yes [provider]  amLODipine (NORVASC) 5 MG tablet Take 1 tablet (5 mg total) by mouth daily. 07/25/20  Yes Geiple, Joshua, PA-C  azithromycin (ZITHROMAX) 500 MG tablet Take one tablet on Saturday AM. 05/18/21  Yes Kadakia, Ajay, MD  Cholecalciferol 50 MCG (2000 UT) CAPS Take 1 capsule by mouth daily.   Yes [provider]  cycloSPORINE (RESTASIS) 0.05 % ophthalmic emulsion Place 1 drop into both eyes daily.   Yes [provider]  diphenhydrAMINE (BENADRYL) 25 mg capsule Take 25 mg by mouth 2 (two) times daily as needed.   Yes [provider]  docusate calcium (SURFAK) 240 MG capsule Take 240 mg by mouth daily.   Yes [provider]  DULoxetine (CYMBALTA) 30 MG capsule Take 30 mg by mouth daily. 02/08/21  Yes [provider]  ferrous sulfate 325 (65 FE) MG tablet Take 325 mg by mouth daily with breakfast.   Yes [provider]  fluticasone (FLONASE) 50 MCG/ACT nasal spray Place 1 spray into the nose daily as needed. allergies   Yes [provider]  letrozole (FEMARA) 2.5 MG tablet Take 2.5 mg by mouth daily.   Yes [provider]  lidocaine-prilocaine (EMLA) cream Apply 1 application topically daily as needed (For rash).    Yes [provider]  lipase/protease/amylase (CREON) 12000 units CPEP capsule Take 12,000 Units by mouth 3 (three) times daily before meals.   Yes [provider]  losartan (COZAAR) 100 MG tablet Take 100 mg by mouth daily.   Yes [provider]  meclizine (ANTIVERT) 12.5 MG tablet Take 1 tablet (12.5 mg total) by mouth 3 (three) times daily. 09/22/19  Yes Kadakia, Ajay, MD  methocarbamol (ROBAXIN) 750 MG tablet Take 750 mg by mouth in the morning and at  bedtime.   Yes [provider]  metoprolol succinate (TOPROL-XL) 25 MG 24 hr tablet Take 25 mg by mouth daily.   Yes [provider]  omeprazole (PRILOSEC) 40 MG capsule Take 40 mg by mouth daily.   Yes [provider]  ondansetron (ZOFRAN) 4 MG tablet Take 1 tablet (4 mg total) by mouth every 8 (eight) hours as needed for nausea. 05/17/21  Yes Kadakia, Ajay, MD  oxybutynin (DITROPAN) 5 MG tablet Take 5 mg by mouth daily. 02/08/21  Yes [provider]  promethazine (PHENERGAN) 25 MG tablet Take 25 mg by mouth every 6 (six) hours as needed for nausea or vomiting.   Yes [provider]  triamcinolone cream (KENALOG) 0.1 % Apply 1 Application topically 2 (two) times daily.   Yes [provider]    Current Facility-Administered Medications  Medication Dose Route Frequency Provider Last Rate Last Admin   0.9 %  sodium chloride infusion  250 mL Intravenous PRN Kadakia,   Ajay, MD       acetaminophen (TYLENOL) tablet 650 mg  650 mg Oral Q6H PRN Kadakia, Ajay, MD   650 mg at 12/02/22 2009   amLODipine (NORVASC) tablet 5 mg  5 mg Oral Daily Kadakia, Ajay, MD   5 mg at 12/02/22 2141   Chlorhexidine Gluconate Cloth 2 % PADS 6 each  6 each Topical Daily Kadakia, Ajay, MD   6 each at 12/02/22 1708   cycloSPORINE (RESTASIS) 0.05 % ophthalmic emulsion 1 drop  1 drop Both Eyes Daily Kadakia, Ajay, MD   1 drop at 12/02/22 2141   DULoxetine (CYMBALTA) DR capsule 30 mg  30 mg Oral Daily Kadakia, Ajay, MD       fluticasone (FLONASE) 50 MCG/ACT nasal spray 1 spray  1 spray Each Nare Daily PRN Kadakia, Ajay, MD       heparin injection 5,000 Units  5,000 Units Subcutaneous Q8H Kadakia, Ajay, MD   5,000 Units at 12/03/22 0626   hydrALAZINE (APRESOLINE) injection 10 mg  10 mg Intravenous Q8H Kadakia, Ajay, MD   10 mg at 12/03/22 0626   lipase/protease/amylase (CREON) capsule 12,000 Units  12,000 Units Oral TID AC Kadakia, Ajay, MD   12,000 Units at 12/03/22 0646    losartan (COZAAR) tablet 100 mg  100 mg Oral Daily Kadakia, Ajay, MD       meclizine (ANTIVERT) tablet 12.5 mg  12.5 mg Oral TID Kadakia, Ajay, MD   12.5 mg at 12/03/22 0106   methocarbamol (ROBAXIN) tablet 750 mg  750 mg Oral QHS Kadakia, Ajay, MD       ondansetron (ZOFRAN) tablet 4 mg  4 mg Oral Q6H PRN Kadakia, Ajay, MD       Or   ondansetron (ZOFRAN) injection 4 mg  4 mg Intravenous Q6H PRN Kadakia, Ajay, MD       oxybutynin (DITROPAN) tablet 5 mg  5 mg Oral QHS Kadakia, Ajay, MD   5 mg at 12/02/22 2140   pantoprazole (PROTONIX) EC tablet 40 mg  40 mg Oral Daily Kadakia, Ajay, MD   40 mg at 12/02/22 2140   Potassium Chloride CR (MICRO-K) capsule CR 8 mEq  8 mEq Oral Daily Kadakia, Ajay, MD       sodium chloride flush (NS) 0.9 % injection 10-40 mL  10-40 mL Intracatheter Q12H Kadakia, Ajay, MD   10 mL at 12/02/22 2143   sodium chloride flush (NS) 0.9 % injection 10-40 mL  10-40 mL Intracatheter PRN Kadakia, Ajay, MD       sodium chloride flush (NS) 0.9 % injection 3 mL  3 mL Intravenous Q12H Kadakia, Ajay, MD   3 mL at 12/02/22 2143   sodium chloride flush (NS) 0.9 % injection 3 mL  3 mL Intravenous PRN Kadakia, Ajay, MD        Allergies as of 12/02/2022   (No Known Allergies)    Review of Systems:    Constitutional: No weight loss, fever, chills, weakness or fatigue HEENT: Eyes: No change in vision               Ears, Nose, Throat:  No change in hearing or congestion Skin: No rash or itching Cardiovascular: No chest pain, chest pressure or palpitations   Respiratory: No SOB or cough Gastrointestinal: See HPI and otherwise negative Genitourinary: No dysuria or change in urinary frequency Neurological: No headache, dizziness or syncope Musculoskeletal: No new muscle or joint pain Hematologic: No bleeding or bruising Psychiatric: No history of depression or anxiety       Physical Exam:  Vital signs in last 24 hours: Temp:  [97.8 F (36.6 C)-98.7 F (37.1 C)] 97.8 F (36.6 C)  (05/08 0400) Pulse Rate:  [53-69] 69 (05/08 0400) Resp:  [17-18] 18 (05/08 0400) BP: (127-182)/(61-96) 127/61 (05/08 0400) SpO2:  [99 %-100 %] 100 % (05/08 0400) Last BM Date : 12/02/22 Last BM recorded by nurses in past 5 days No data recorded  General:   Pleasant, well developed female in no acute distress Head:  Normocephalic and atraumatic. Eyes: sclerae anicteric,conjunctive pink  Heart:  regular rate and rhythm, no murmurs or gallops Pulm: Clear anteriorly; no wheezing Abdomen:  Soft, Non-distended AB, Active bowel sounds. mild tenderness in the epigastrium. Without guarding and Without rebound, No organomegaly appreciated. Extremities:  Without edema. Msk:  Symmetrical without gross deformities. Peripheral pulses intact.  Neurologic:  Alert and  oriented x4; patient has right facial droop states this is more chronic, no pronator drift, fairly good strength bilateral upper and lower extremities. Skin:   Dry and intact without significant lesions or rashes. Psychiatric:  Cooperative. Normal mood and affect.  LAB RESULTS: Recent Labs    12/02/22 1537  WBC 3.9*  HGB 11.5*  HCT 37.1  PLT 219   BMET Recent Labs    12/02/22 1537 12/03/22 0430  NA 138 137  K 3.5 3.2*  CL 107 108  CO2 20* 23  GLUCOSE 103* 134*  BUN 14 12  CREATININE 0.76 0.88  CALCIUM 8.1* 7.9*   LFT Recent Labs    12/02/22 1537  PROT 5.8*  ALBUMIN 3.2*  AST 18  ALT 13  ALKPHOS 24*  BILITOT 0.8   PT/INR No results for input(s): "LABPROT", "INR" in the last 72 hours.  STUDIES: ECHOCARDIOGRAM COMPLETE  Result Date: 12/02/2022    ECHOCARDIOGRAM REPORT   Patient Name:   Rebekah Steele Date of Exam: 12/02/2022 Medical Rec #:  [id number]      Height:       67.0 in Accession #:    [id number]     Weight:       138.0 lb Date of Birth:  02/13/1951      BSA:          1.727 m Patient Age:    71 years       BP:           182/96 mmHg Patient Gender: F              HR:           52 bpm. Exam Location:   Inpatient Procedure: 2D Echo, Cardiac Doppler and Color Doppler Indications:     Cardiomyopathy-ischemic I25.5  History:         Patient has prior history of Echocardiogram examinations, most                  recent 09/16/2020. Signs/Symptoms:Chest Pain and Syncope; Risk                  Factors:Hypertension.  Sonographer:     Shanika Turnbull Referring Phys:  [id number] AJAY KADAKIA Diagnosing Phys: Ajay Kadakia MD  Sonographer Comments: Image acquisition challenging due to patient body habitus and Image acquisition challenging due to mastectomy. IMPRESSIONS  1. Left ventricular ejection fraction, by estimation, is 55 to 60%. The left ventricle has normal function. The left ventricle has no regional wall motion abnormalities. Left ventricular diastolic parameters are consistent with Grade I diastolic dysfunction (impaired relaxation).  2. Right ventricular systolic function   is normal. The right ventricular size is normal.  3. Left atrial size was moderately dilated.  4. Right atrial size was mild to moderately dilated.  5. The mitral valve is degenerative. Moderate mitral valve regurgitation.  6. The aortic valve is tricuspid. There is mild calcification of the aortic valve. There is mild thickening of the aortic valve. Aortic valve regurgitation is not visualized. Aortic valve sclerosis is present, with no evidence of aortic valve stenosis.  7. The inferior vena cava is dilated in size with >50% respiratory variability, suggesting right atrial pressure of 8 mmHg. FINDINGS  Left Ventricle: Left ventricular ejection fraction, by estimation, is 55 to 60%. The left ventricle has normal function. The left ventricle has no regional wall motion abnormalities. The left ventricular internal cavity size was normal in size. There is  borderline asymmetric left ventricular hypertrophy of the infero-lateral segment. Left ventricular diastolic parameters are consistent with Grade I diastolic dysfunction (impaired relaxation). Right  Ventricle: The right ventricular size is normal. No increase in right ventricular wall thickness. Right ventricular systolic function is normal. Left Atrium: Left atrial size was moderately dilated. Right Atrium: Right atrial size was mild to moderately dilated. Pericardium: There is no evidence of pericardial effusion. Mitral Valve: The mitral valve is degenerative in appearance. There is mild calcification of the mitral valve leaflet(s). Mild mitral annular calcification. Moderate mitral valve regurgitation. Tricuspid Valve: The tricuspid valve is normal in structure. Tricuspid valve regurgitation is mild. Aortic Valve: The aortic valve is tricuspid. There is mild calcification of the aortic valve. There is mild thickening of the aortic valve. There is mild aortic valve annular calcification. Aortic valve regurgitation is not visualized. Aortic valve sclerosis is present, with no evidence of aortic valve stenosis. Aortic valve peak gradient measures 6.9 mmHg. Pulmonic Valve: The pulmonic valve was normal in structure. Pulmonic valve regurgitation is trivial. Aorta: The aortic root is normal in size and structure. Venous: The inferior vena cava is dilated in size with greater than 50% respiratory variability, suggesting right atrial pressure of 8 mmHg. IAS/Shunts: The atrial septum is grossly normal.  LEFT VENTRICLE PLAX 2D LVIDd:         5.10 cm   Diastology LVIDs:         3.60 cm   LV e' medial:    5.91 cm/s LV PW:         1.20 cm   LV E/e' medial:  13.0 LV IVS:        0.90 cm   LV e' lateral:   11.20 cm/s LVOT diam:     2.20 cm   LV E/e' lateral: 6.8 LV SV:         81 LV SV Index:   47 LVOT Area:     3.80 cm  RIGHT VENTRICLE             IVC RV S prime:     12.30 cm/s  IVC diam: 2.60 cm TAPSE (M-mode): 2.6 cm LEFT ATRIUM             Index        RIGHT ATRIUM           Index LA diam:        3.40 cm 1.97 cm/m   RA Area:     17.60 cm LA Vol (A2C):   47.5 ml 27.50 ml/m  RA Volume:   51.00 ml  29.53 ml/m LA Vol  (A4C):   61.9 ml 35.84 ml/m LA Biplane   Vol: 57.7 ml 33.41 ml/m  AORTIC VALVE AV Area (Vmax): 2.73 cm AV Vmax:        131.00 cm/s AV Peak Grad:   6.9 mmHg LVOT Vmax:      94.10 cm/s LVOT Vmean:     59.833 cm/s LVOT VTI:       0.214 m  AORTA Ao Root diam: 3.40 cm Ao Asc diam:  3.60 cm MITRAL VALVE               TRICUSPID VALVE MV Area (PHT): 4.63 cm    TR Peak grad:   19.9 mmHg MV Decel Time: 164 msec    TR Vmax:        223.00 cm/s MV E velocity: 76.60 cm/s MV A velocity: 75.70 cm/s  SHUNTS MV E/A ratio:  1.01        Systemic VTI:  0.21 m                            Systemic Diam: 2.20 cm Ajay Kadakia MD Electronically signed by Ajay Kadakia MD Signature Date/Time: 12/02/2022/8:47:23 PM    Final    MR BRAIN WO CONTRAST  Result Date: 12/02/2022 CLINICAL DATA:  Acute neurologic deficit EXAM: MRI HEAD WITHOUT CONTRAST TECHNIQUE: Multiplanar, multiecho pulse sequences of the brain and surrounding structures were obtained without intravenous contrast. COMPARISON:  None Available. FINDINGS: Brain: No acute infarct, mass effect or extra-axial collection. No acute or chronic hemorrhage. Old right cerebellar infarct. There is multifocal hyperintense T2-weighted signal within the white matter. Parenchymal volume and CSF spaces are normal. The midline structures are normal. Vascular: Major flow voids are preserved. Skull and upper cervical spine: Normal calvarium and skull base. Visualized upper cervical spine and soft tissues are normal. Sinuses/Orbits:No paranasal sinus fluid levels or advanced mucosal thickening. No mastoid or middle ear effusion. Normal orbits. IMPRESSION: 1. No acute intracranial abnormality. 2. 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 Electronically Signed   By: Kevin  Herman M.D.   On: 12/02/2022 19:52      Impression    Nausea, vomiting, epigastric discomfort for the last 3 months preventing patient from being able to take antihypertensive medications History  of Roux-en-Y, pancreatic cancer status post Whipple, GERD Has been off reflux medications for 3 months States can have nausea even with looking at medications, no dysphagia or issues with food. Last imaging a year ago at [facility]  Uncontrolled hypertension associated right-sided weakness BP controlled IV hydralazine MRI brain negative Neurology consult pending No focal neurodeficits on exam On heparin every 8 hours  Principal Problem:   Right sided weakness    LOS: 1 day     Plan   Unusual presentation for nausea and vomiting but could be related to reflux, previous Roux-en-Y, previous pancreatic cancer has not had imaging in over a year. -Continue supportive care with antiemetics states symptoms have improved. -Continue pantoprazole 40 mg once daily -With CT abdomen pelvis with contrast, has IV allergy, allergy protocol placed. -With history of Roux-en-Y and pancreatic cancer we will plan tentatively for endoscopic evaluation tomorrow with EGD Dr. Pyrtle. -Regular diet today -N.p.o. 5 AM -Hold heparin 4 hours prior to procedure  Thank you for your kind consultation, we will continue to follow.    Jerrine Urschel R Aaron Boeh  12/03/2022, 9:24 AM  

## 2022-12-03 NOTE — Consult Note (Signed)
Consultation  Referring Provider:   Cardiology Primary Care Physician:  Orpah Cobb, MD Primary Gastroenterologist:  Dr. Johny Blamer        Reason for Consultation:   Nausea and vomiting, inability to take p.o.         HPI:   Rebekah Steele is a 72 y.o. 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  Follows with [facility] oncology and Dr. Johny Blamer for GI cancer clinic.  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 GI consulted due to nausea and vomiting with inability to take p.o.  08/21/17 Endoscopy normal esophagus. 2 cm hiatal hernia. Normal jejunum. Roux en Y gastrojej  08/23/21 CT chest stable 12/20/21 MRI abd, CT chest stable with NED  MRI at that time showed unremarkable liver, no bile duct dilation, normal spleen, status post Whipple normal GI tract no dilation or wall thickening.  No lymphadenopathy.  IV hydralazine normalize BP. MRI brainShows old right cerebellar infarct chronic small vessel ischemia  Echocardiogram unremarkable White blood cell count 3.9, hemoglobin 11.5, MCV 90.3 Potassium 3.2, glucose 134, calcium 7.9, alk phos 24, albumin 3.2, AST 18, ALT 13, total bilirubin 5.8.  No family at bedside during evaluation. Patient states for past 3 months she has been having nausea and vomiting after taking her medications. States has been able to eat and drink well but when taking her medications even looking at them will cause nausea. She will take her pills feels like they go down all the way and then will have nausea and vomit up clear liquid/bile.   Will have associated abdominal discomfort during that time relieved with vomiting. Patient denies reflux, was on Prilosec outpatient once daily but stopped 2 to 3 months ago. Denies dysphagia, odynophagia. Has bowel movement at least once daily occasionally twice daily, takes Creon for EPI which she states helped, denies melena or hematochezia.  Has had gray/milky colored stools last several weeks associate with nausea and vomiting. Denies fever chills, weight loss. Denies NSAID use, alcohol, drug use, smoking history.  Abnormal ED labs: Abnormal Labs Reviewed  COMPREHENSIVE METABOLIC PANEL - Abnormal; Notable for the following components:      Result Value   CO2 20 (*)    Glucose, Bld 103 (*)    Calcium 8.1 (*)    Total Protein 5.8 (*)    Albumin 3.2 (*)    Alkaline Phosphatase 24 (*)    All other components within normal limits  CBC WITH DIFFERENTIAL/PLATELET - Abnormal; Notable for the following components:   WBC 3.9 (*)    Hemoglobin 11.5 (*)    All other components within normal limits  HEMOGLOBIN A1C - Abnormal; Notable for the following components:   Hgb A1c MFr Bld 6.3 (*)    All other components within normal limits  BASIC METABOLIC PANEL - Abnormal; Notable for the following components:   Potassium 3.2 (*)    Glucose, Bld 134 (*)    Calcium 7.9 (*)    All other components within normal limits     Past Medical History:  Diagnosis Date   Bilateral leg edema 11/2015   Cancer (HCC)  breast & pancreatic   GERD (gastroesophageal reflux disease)    Hypertension    Trigeminal neuralgia     Surgical History:  She  has a past surgical history that includes Cholecystectomy; Abdominal hysterectomy; Breast lumpectomy; Facial nerve surgery; and Cardiac catheterization (N/A, 12/31/2015). Family History:  Her family history is not on file. Social History:   reports that she has never smoked. She has never used smokeless tobacco. She reports that she does not drink alcohol and  does not use drugs.  Prior to Admission medications   Medication Sig Start Date End Date Taking? Authorizing Provider  acetaminophen (TYLENOL) 500 MG tablet Take 500 mg by mouth every 6 (six) hours as needed. pain   Yes [provider]  amLODipine (NORVASC) 5 MG tablet Take 1 tablet (5 mg total) by mouth daily. 07/25/20  Yes Renne Crigler, PA-C  azithromycin (ZITHROMAX) 500 MG tablet Take one tablet on Saturday AM. 05/18/21  Yes Orpah Cobb, MD  Cholecalciferol 50 MCG (2000 UT) CAPS Take 1 capsule by mouth daily.   Yes [provider]  cycloSPORINE (RESTASIS) 0.05 % ophthalmic emulsion Place 1 drop into both eyes daily.   Yes [provider]  diphenhydrAMINE (BENADRYL) 25 mg capsule Take 25 mg by mouth 2 (two) times daily as needed.   Yes [provider]  docusate calcium (SURFAK) 240 MG capsule Take 240 mg by mouth daily.   Yes [provider]  DULoxetine (CYMBALTA) 30 MG capsule Take 30 mg by mouth daily. 02/08/21  Yes [provider]  ferrous sulfate 325 (65 FE) MG tablet Take 325 mg by mouth daily with breakfast.   Yes [provider]  fluticasone (FLONASE) 50 MCG/ACT nasal spray Place 1 spray into the nose daily as needed. allergies   Yes [provider]  letrozole (FEMARA) 2.5 MG tablet Take 2.5 mg by mouth daily.   Yes [provider]  lidocaine-prilocaine (EMLA) cream Apply 1 application topically daily as needed (For rash).    Yes [provider]  lipase/protease/amylase (CREON) 12000 units CPEP capsule Take 12,000 Units by mouth 3 (three) times daily before meals.   Yes [provider]  losartan (COZAAR) 100 MG tablet Take 100 mg by mouth daily.   Yes [provider]  meclizine (ANTIVERT) 12.5 MG tablet Take 1 tablet (12.5 mg total) by mouth 3 (three) times daily. 09/22/19  Yes Orpah Cobb, MD  methocarbamol (ROBAXIN) 750 MG tablet Take 750 mg by mouth in the morning and at  bedtime.   Yes [provider]  metoprolol succinate (TOPROL-XL) 25 MG 24 hr tablet Take 25 mg by mouth daily.   Yes [provider]  omeprazole (PRILOSEC) 40 MG capsule Take 40 mg by mouth daily.   Yes [provider]  ondansetron (ZOFRAN) 4 MG tablet Take 1 tablet (4 mg total) by mouth every 8 (eight) hours as needed for nausea. 05/17/21  Yes Orpah Cobb, MD  oxybutynin (DITROPAN) 5 MG tablet Take 5 mg by mouth daily. 02/08/21  Yes [provider]  promethazine (PHENERGAN) 25 MG tablet Take 25 mg by mouth every 6 (six) hours as needed for nausea or vomiting.   Yes [provider]  triamcinolone cream (KENALOG) 0.1 % Apply 1 Application topically 2 (two) times daily.   Yes [provider]    Current Facility-Administered Medications  Medication Dose Route Frequency Provider Last Rate Last Admin   0.9 %  sodium chloride infusion  250 mL Intravenous PRN Algie Coffer,  Viviano Simas, MD       acetaminophen (TYLENOL) tablet 650 mg  650 mg Oral Q6H PRN Orpah Cobb, MD   650 mg at 12/02/22 2009   amLODipine (NORVASC) tablet 5 mg  5 mg Oral Daily Orpah Cobb, MD   5 mg at 12/02/22 2141   Chlorhexidine Gluconate Cloth 2 % PADS 6 each  6 each Topical Daily Orpah Cobb, MD   6 each at 12/02/22 1708   cycloSPORINE (RESTASIS) 0.05 % ophthalmic emulsion 1 drop  1 drop Both Eyes Daily Orpah Cobb, MD   1 drop at 12/02/22 2141   DULoxetine (CYMBALTA) DR capsule 30 mg  30 mg Oral Daily Orpah Cobb, MD       fluticasone (FLONASE) 50 MCG/ACT nasal spray 1 spray  1 spray Each Nare Daily PRN Orpah Cobb, MD       heparin injection 5,000 Units  5,000 Units Subcutaneous Q8H Orpah Cobb, MD   5,000 Units at 12/03/22 0626   hydrALAZINE (APRESOLINE) injection 10 mg  10 mg Intravenous Q8H Orpah Cobb, MD   10 mg at 12/03/22 0626   lipase/protease/amylase (CREON) capsule 12,000 Units  12,000 Units Oral TID Deborah Chalk, MD   12,000 Units at 12/03/22 0646    losartan (COZAAR) tablet 100 mg  100 mg Oral Daily Orpah Cobb, MD       meclizine (ANTIVERT) tablet 12.5 mg  12.5 mg Oral TID Orpah Cobb, MD   12.5 mg at 12/03/22 0106   methocarbamol (ROBAXIN) tablet 750 mg  750 mg Oral QHS Orpah Cobb, MD       ondansetron (ZOFRAN) tablet 4 mg  4 mg Oral Q6H PRN Orpah Cobb, MD       Or   ondansetron (ZOFRAN) injection 4 mg  4 mg Intravenous Q6H PRN Orpah Cobb, MD       oxybutynin (DITROPAN) tablet 5 mg  5 mg Oral QHS Orpah Cobb, MD   5 mg at 12/02/22 2140   pantoprazole (PROTONIX) EC tablet 40 mg  40 mg Oral Daily Orpah Cobb, MD   40 mg at 12/02/22 2140   Potassium Chloride CR (MICRO-K) capsule CR 8 mEq  8 mEq Oral Daily Orpah Cobb, MD       sodium chloride flush (NS) 0.9 % injection 10-40 mL  10-40 mL Intracatheter Q12H Orpah Cobb, MD   10 mL at 12/02/22 2143   sodium chloride flush (NS) 0.9 % injection 10-40 mL  10-40 mL Intracatheter PRN Orpah Cobb, MD       sodium chloride flush (NS) 0.9 % injection 3 mL  3 mL Intravenous Q12H Orpah Cobb, MD   3 mL at 12/02/22 2143   sodium chloride flush (NS) 0.9 % injection 3 mL  3 mL Intravenous PRN Orpah Cobb, MD        Allergies as of 12/02/2022   (No Known Allergies)    Review of Systems:    Constitutional: No weight loss, fever, chills, weakness or fatigue HEENT: Eyes: No change in vision               Ears, Nose, Throat:  No change in hearing or congestion Skin: No rash or itching Cardiovascular: No chest pain, chest pressure or palpitations   Respiratory: No SOB or cough Gastrointestinal: See HPI and otherwise negative Genitourinary: No dysuria or change in urinary frequency Neurological: No headache, dizziness or syncope Musculoskeletal: No new muscle or joint pain Hematologic: No bleeding or bruising Psychiatric: No history of depression or anxiety  Physical Exam:  Vital signs in last 24 hours: Temp:  [97.8 F (36.6 C)-98.7 F (37.1 C)] 97.8 F (36.6 C)  (05/08 0400) Pulse Rate:  [53-69] 69 (05/08 0400) Resp:  [17-18] 18 (05/08 0400) BP: (127-182)/(61-96) 127/61 (05/08 0400) SpO2:  [99 %-100 %] 100 % (05/08 0400) Last BM Date : 12/02/22 Last BM recorded by nurses in past 5 days No data recorded  General:   Pleasant, well developed female in no acute distress Head:  Normocephalic and atraumatic. Eyes: sclerae anicteric,conjunctive pink  Heart:  regular rate and rhythm, no murmurs or gallops Pulm: Clear anteriorly; no wheezing Abdomen:  Soft, Non-distended AB, Active bowel sounds. mild tenderness in the epigastrium. Without guarding and Without rebound, No organomegaly appreciated. Extremities:  Without edema. Msk:  Symmetrical without gross deformities. Peripheral pulses intact.  Neurologic:  Alert and  oriented x4; patient has right facial droop states this is more chronic, no pronator drift, fairly good strength bilateral upper and lower extremities. Skin:   Dry and intact without significant lesions or rashes. Psychiatric:  Cooperative. Normal mood and affect.  LAB RESULTS: Recent Labs    12/02/22 1537  WBC 3.9*  HGB 11.5*  HCT 37.1  PLT 219   BMET Recent Labs    12/02/22 1537 12/03/22 0430  NA 138 137  K 3.5 3.2*  CL 107 108  CO2 20* 23  GLUCOSE 103* 134*  BUN 14 12  CREATININE 0.76 0.88  CALCIUM 8.1* 7.9*   LFT Recent Labs    12/02/22 1537  PROT 5.8*  ALBUMIN 3.2*  AST 18  ALT 13  ALKPHOS 24*  BILITOT 0.8   PT/INR No results for input(s): "LABPROT", "INR" in the last 72 hours.  STUDIES: ECHOCARDIOGRAM COMPLETE  Result Date: 12/02/2022    ECHOCARDIOGRAM REPORT   Patient Name:   Rebekah Steele Date of Exam: 12/02/2022 Medical Rec #:  [id number]      Height:       67.0 in Accession #:    [id number]     Weight:       138.0 lb Date of Birth:  17-Aug-1950      BSA:          1.727 m Patient Age:    71 years       BP:           182/96 mmHg Patient Gender: F              HR:           52 bpm. Exam Location:   Inpatient Procedure: 2D Echo, Cardiac Doppler and Color Doppler Indications:     Cardiomyopathy-ischemic I25.5  History:         Patient has prior history of Echocardiogram examinations, most                  recent 09/16/2020. Signs/Symptoms:Chest Pain and Syncope; Risk                  Factors:Hypertension.  Sonographer:     Lucendia Herrlich Referring Phys:  [id number] Orpah Cobb Diagnosing Phys: Orpah Cobb MD  Sonographer Comments: Image acquisition challenging due to patient body habitus and Image acquisition challenging due to mastectomy. IMPRESSIONS  1. Left ventricular ejection fraction, by estimation, is 55 to 60%. The left ventricle has normal function. The left ventricle has no regional wall motion abnormalities. Left ventricular diastolic parameters are consistent with Grade I diastolic dysfunction (impaired relaxation).  2. Right ventricular systolic function  is normal. The right ventricular size is normal.  3. Left atrial size was moderately dilated.  4. Right atrial size was mild to moderately dilated.  5. The mitral valve is degenerative. Moderate mitral valve regurgitation.  6. The aortic valve is tricuspid. There is mild calcification of the aortic valve. There is mild thickening of the aortic valve. Aortic valve regurgitation is not visualized. Aortic valve sclerosis is present, with no evidence of aortic valve stenosis.  7. The inferior vena cava is dilated in size with >50% respiratory variability, suggesting right atrial pressure of 8 mmHg. FINDINGS  Left Ventricle: Left ventricular ejection fraction, by estimation, is 55 to 60%. The left ventricle has normal function. The left ventricle has no regional wall motion abnormalities. The left ventricular internal cavity size was normal in size. There is  borderline asymmetric left ventricular hypertrophy of the infero-lateral segment. Left ventricular diastolic parameters are consistent with Grade I diastolic dysfunction (impaired relaxation). Right  Ventricle: The right ventricular size is normal. No increase in right ventricular wall thickness. Right ventricular systolic function is normal. Left Atrium: Left atrial size was moderately dilated. Right Atrium: Right atrial size was mild to moderately dilated. Pericardium: There is no evidence of pericardial effusion. Mitral Valve: The mitral valve is degenerative in appearance. There is mild calcification of the mitral valve leaflet(s). Mild mitral annular calcification. Moderate mitral valve regurgitation. Tricuspid Valve: The tricuspid valve is normal in structure. Tricuspid valve regurgitation is mild. Aortic Valve: The aortic valve is tricuspid. There is mild calcification of the aortic valve. There is mild thickening of the aortic valve. There is mild aortic valve annular calcification. Aortic valve regurgitation is not visualized. Aortic valve sclerosis is present, with no evidence of aortic valve stenosis. Aortic valve peak gradient measures 6.9 mmHg. Pulmonic Valve: The pulmonic valve was normal in structure. Pulmonic valve regurgitation is trivial. Aorta: The aortic root is normal in size and structure. Venous: The inferior vena cava is dilated in size with greater than 50% respiratory variability, suggesting right atrial pressure of 8 mmHg. IAS/Shunts: The atrial septum is grossly normal.  LEFT VENTRICLE PLAX 2D LVIDd:         5.10 cm   Diastology LVIDs:         3.60 cm   LV e' medial:    5.91 cm/s LV PW:         1.20 cm   LV E/e' medial:  13.0 LV IVS:        0.90 cm   LV e' lateral:   11.20 cm/s LVOT diam:     2.20 cm   LV E/e' lateral: 6.8 LV SV:         81 LV SV Index:   47 LVOT Area:     3.80 cm  RIGHT VENTRICLE             IVC RV S prime:     12.30 cm/s  IVC diam: 2.60 cm TAPSE (M-mode): 2.6 cm LEFT ATRIUM             Index        RIGHT ATRIUM           Index LA diam:        3.40 cm 1.97 cm/m   RA Area:     17.60 cm LA Vol (A2C):   47.5 ml 27.50 ml/m  RA Volume:   51.00 ml  29.53 ml/m LA Vol  (A4C):   61.9 ml 35.84 ml/m LA Biplane  Vol: 57.7 ml 33.41 ml/m  AORTIC VALVE AV Area (Vmax): 2.73 cm AV Vmax:        131.00 cm/s AV Peak Grad:   6.9 mmHg LVOT Vmax:      94.10 cm/s LVOT Vmean:     59.833 cm/s LVOT VTI:       0.214 m  AORTA Ao Root diam: 3.40 cm Ao Asc diam:  3.60 cm MITRAL VALVE               TRICUSPID VALVE MV Area (PHT): 4.63 cm    TR Peak grad:   19.9 mmHg MV Decel Time: 164 msec    TR Vmax:        223.00 cm/s MV E velocity: 76.60 cm/s MV A velocity: 75.70 cm/s  SHUNTS MV E/A ratio:  1.01        Systemic VTI:  0.21 m                            Systemic Diam: 2.20 cm Orpah Cobb MD Electronically signed by Orpah Cobb MD Signature Date/Time: 12/02/2022/8:47:23 PM    Final    MR BRAIN WO CONTRAST  Result Date: 12/02/2022 CLINICAL DATA:  Acute neurologic deficit EXAM: MRI HEAD WITHOUT CONTRAST TECHNIQUE: Multiplanar, multiecho pulse sequences of the brain and surrounding structures were obtained without intravenous contrast. COMPARISON:  None Available. FINDINGS: Brain: No acute infarct, mass effect or extra-axial collection. No acute or chronic hemorrhage. Old right cerebellar infarct. There is multifocal hyperintense T2-weighted signal within the white matter. Parenchymal volume and CSF spaces are normal. The midline structures are normal. Vascular: Major flow voids are preserved. Skull and upper cervical spine: Normal calvarium and skull base. Visualized upper cervical spine and soft tissues are normal. Sinuses/Orbits:No paranasal sinus fluid levels or advanced mucosal thickening. No mastoid or middle ear effusion. Normal orbits. IMPRESSION: 1. No acute intracranial abnormality. 2. 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 Electronically Signed   By: Deatra Robinson M.D.   On: 12/02/2022 19:52      Impression    Nausea, vomiting, epigastric discomfort for the last 3 months preventing patient from being able to take antihypertensive medications History  of Roux-en-Y, pancreatic cancer status post Whipple, GERD Has been off reflux medications for 3 months States can have nausea even with looking at medications, no dysphagia or issues with food. Last imaging a year ago at [facility]  Uncontrolled hypertension associated right-sided weakness BP controlled IV hydralazine MRI brain negative Neurology consult pending No focal neurodeficits on exam On heparin every 8 hours  Principal Problem:   Right sided weakness    LOS: 1 day     Plan   Unusual presentation for nausea and vomiting but could be related to reflux, previous Roux-en-Y, previous pancreatic cancer has not had imaging in over a year. -Continue supportive care with antiemetics states symptoms have improved. -Continue pantoprazole 40 mg once daily -With CT abdomen pelvis with contrast, has IV allergy, allergy protocol placed. -With history of Roux-en-Y and pancreatic cancer we will plan tentatively for endoscopic evaluation tomorrow with EGD Dr. Rhea Belton. -Regular diet today -N.p.o. 5 AM -Hold heparin 4 hours prior to procedure  Thank you for your kind consultation, we will continue to follow.    Doree Albee  12/03/2022, 9:24 AM

## 2022-12-03 NOTE — Progress Notes (Addendum)
NEUROLOGY CONSULTATION NOTE   Date of service: Dec 03, 2022 Patient Name: Rebekah Steele MRN:  [id number] DOB:  06-Aug-1950 Reason for consult: Right sided weakness _ _ _   _ __   _ __ _ _  __ __   _ __   __ _  History of Present Illness  Rebekah Steele is a 72 y.o. 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 Neurology was consulted due to persistent right sided weakness and numbness.   Rebekah. Dellarocco notes for the past 3-4 months she has had weakness of her proximal right arm and leg. She has noticed what she describes as a crinkling feeling in her right cheek. Also endorses associated numbness of her shoulder and upper thigh. These symptoms have not worsened nor improved over the time course. She denies left sided symptoms. She has a history of vertigo which she describes as a dizziness. Prior to arrival she noticed persistent dizziness and feeling off balanced with her severe hypertension. This has improved with lowering of her blood pressure.   Denies visual field changes, difficulties with swallowing or tasting. She is unsure how her gait is as she has not walked long distances since her admission. Denies changes in her urinary or bowel habits.   ROS   As per HPI  Past History   Past Medical History:  Diagnosis Date   Bilateral leg edema 11/2015   Cancer [facility])    breast & pancreatic   GERD (gastroesophageal reflux disease)    Hypertension    Trigeminal neuralgia    Past Surgical History:  Procedure Laterality Date   ABDOMINAL HYSTERECTOMY     BREAST LUMPECTOMY     CARDIAC CATHETERIZATION  N/A 12/31/2015   Procedure: Left Heart Cath and Coronary Angiography;  Surgeon: Orpah Cobb, MD;  Location: [facility] INVASIVE CV LAB;  Service: Cardiovascular;  Laterality: N/A;   CHOLECYSTECTOMY     FACIAL NERVE SURGERY     No family history on file. Social History   Socioeconomic History   Marital status: Legally Separated    Spouse name: Not on file   Number of children: Not on file   Years of education: Not on file   Highest education level: Not on file  Occupational History   Not on file  Tobacco Use   Smoking status: Never   Smokeless tobacco: Never  Vaping Use   Vaping Use: Never used  Substance and Sexual Activity   Alcohol use: No   Drug use: No   Sexual activity: Not on file  Other Topics Concern   Not on file  Social History Narrative   Not on file   Social Determinants of Health   Financial Resource Strain: Not on file  Food Insecurity: Not on file  Transportation Needs: Not on file  Physical Activity: Not on file  Stress: Not on file  Social Connections: Not on file   Allergies  Allergen Reactions   Iodinated Contrast Media Anaphylaxis and Hives    Hives and oral swelling   Hives and oral swelling   Hives and oral swelling   Latex Rash and Itching    From powder in  glove states patient blisters   Wound Dressing Adhesive Itching    Paper  Tape - skin blisters    Medications   Medications Prior to Admission  Medication Sig Dispense Refill Last Dose   acetaminophen (TYLENOL) 500 MG tablet Take 500 mg by mouth every 6 (six) hours as needed. pain   Past Month   amLODipine (NORVASC) 5 MG tablet Take 1 tablet (5 mg total) by mouth daily. 30 tablet 0 Past Month   azithromycin (ZITHROMAX) 500 MG tablet Take one tablet on Saturday AM. 1 tablet 0 Past Week   Cholecalciferol 50 MCG (2000 UT) CAPS Take 1 capsule by mouth daily.   Past Month   cycloSPORINE (RESTASIS) 0.05 % ophthalmic emulsion Place 1 drop into both eyes daily.   12/02/2022   diphenhydrAMINE  (BENADRYL) 25 mg capsule Take 25 mg by mouth 2 (two) times daily as needed.   Past Month   docusate calcium (SURFAK) 240 MG capsule Take 240 mg by mouth daily.   Past Week   DULoxetine (CYMBALTA) 30 MG capsule Take 30 mg by mouth daily.   Past Month   ferrous sulfate 325 (65 FE) MG tablet Take 325 mg by mouth daily with breakfast.   Past Week   fluticasone (FLONASE) 50 MCG/ACT nasal spray Place 1 spray into the nose daily as needed. allergies   Past Month   letrozole (FEMARA) 2.5 MG tablet Take 2.5 mg by mouth daily.   Past Month   lidocaine-prilocaine (EMLA) cream Apply 1 application topically daily as needed (For rash).    Past Month   lipase/protease/amylase (CREON) 12000 units CPEP capsule Take 12,000 Units by mouth 3 (three) times daily before meals.   Past Week   losartan (COZAAR) 100 MG tablet Take 100 mg by mouth daily.   Past Month   meclizine (ANTIVERT) 12.5 MG tablet Take 1 tablet (12.5 mg total) by mouth 3 (three) times daily. 90 tablet 3 Past Month   methocarbamol (ROBAXIN) 750 MG tablet Take 750 mg by mouth in the morning and at bedtime.   Past Month   metoprolol succinate (TOPROL-XL) 25 MG 24 hr tablet Take 25 mg by mouth daily.   Past Month   omeprazole (PRILOSEC) 40 MG capsule Take 40 mg by mouth daily.   Past Month   ondansetron (ZOFRAN) 4 MG tablet Take 1 tablet (4 mg total) by mouth every 8 (eight) hours as needed for nausea. 90 tablet 1 Past Month   oxybutynin (DITROPAN) 5 MG tablet Take 5 mg by mouth daily.   Past Month   promethazine (PHENERGAN) 25 MG tablet Take 25 mg by mouth every 6 (six) hours as needed for nausea or vomiting.   Past Month   triamcinolone cream (KENALOG) 0.1 % Apply 1 Application topically 2 (two) times daily.   Past Week     Vitals   Vitals:   12/02/22 2100 12/03/22 0100 12/03/22 0400 12/03/22 1227  BP: (!) 146/79 (!) 148/82 127/61   Pulse: 63  69 82  Resp: 18  18   Temp: 97.8 F (36.6 C) 97.9 F (36.6 C) 97.8 F (36.6 C) 97.8 F (36.6 C)   TempSrc: Oral Oral Oral Oral  SpO2: 99%  100%      There is no height or weight on file to calculate BMI.  Physical Exam   General: Laying comfortably in bed; in no acute distress. HENT: Normal oropharynx and mucosa. Normal external appearance of ears and nose. Neck: Supple, no pain or tenderness  CV: RRR. No peripheral edema. Pulmonary: Symmetric Chest rise. Normal respiratory effort. Abdomen: Soft to touch, non-tender. Ext: No edema present, Skin: No rash. Normal palpation of skin.  Musculoskeletal: Normal digits and nails by inspection. No clubbing. Mental Status: alert and oriented to person place time and situation Cranial Nerves: II normla visual fields III/IV/VI- Extra ocular motion intact. Pupils equal round reactive to light V/VII- Decreased sensation to touch right side of face VIII- Decreased hearing in right ear. Right sided facial droop IX/X- No trouble swallowing.  XI- Right shoulder weakness XII- No tongue movement abnormalities  Motor: 4/5 strength right shoulder, right hip flexion. 5/5 left upper and lower extremity strength. Normal tone and bulk of muscles Sensory: Abnormal sensation of right face, right upper arm and right upper thigh DTR's: Brachioradials and patellar reflexes 1+ on the right, 2+ on left Cerebellar: Did not ambulate patient Finger to nose - normal Pronator drift - absent  Heel to shin - normal  Dysdiadochokinesia absent    Labs   CBC:  Recent Labs  Lab 12/02/22 1537  WBC 3.9*  NEUTROABS 2.2  HGB 11.5*  HCT 37.1  MCV 90.3  PLT 219    Basic Metabolic Panel:  Lab Results  Component Value Date   NA 137 12/03/2022   K 3.2 (L) 12/03/2022   CO2 23 12/03/2022   GLUCOSE 134 (H) 12/03/2022   BUN 12 12/03/2022   CREATININE 0.88 12/03/2022   CALCIUM 7.9 (L) 12/03/2022   GFRNONAA >60 12/03/2022   GFRAA 52 (L) 09/20/2019   Lipid Panel:  Lab Results  Component Value Date   LDLCALC 73 02/26/2017   HgbA1c:  Lab Results   Component Value Date   HGBA1C 6.3 (H) 12/02/2022    CT Head without contrast: Postsurgical changes involving the right cerebellum. No evidence of acute intracranial abnormality.  CT angio Head and Neck with contrast: Pending  MRI Brain  No acute intracranial abnormality. 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  Transthoracic echocardiogram LVEF of 55-60%. Grade I Diastolic dysfunction Moderately dilated left atrium Normal RV systolic function Right atrial mild/moderately dilated Thickened aortic valve  Impression   Rebekah Steele is a 72 y/o person living with a pertinent history of prior breast and pancreatic cancer s/p chemotherapy and surgical interventions, trigeminal neuralgia s/p surgical intervention who presented with severe hypertension secondary to medication non-adherence with persistent nausea and vomiting. GI is currently evaluating with imaging and possible endoscopic evaluation for her persistent nausea and vomiting.   Her presentation of dizziness and ataxia that have resolved could certainly represent TIA with hypertension resulting as opposed to causing symptoms. With her chronic residual symptoms of right sided weakness, suspect prior CVA. MRI imaging consistent with prior cerebellar CVA but no other areas of concern. Likely ischemic etiology w/ hx of uncontrolled HTN and pre-diabetes. Lipid panel pending. Was on aspirin 81 mg daily and will restart today. Will check lipid panel and plan to start statin therapy for secondary prevention. Echocardiogram performed and without evidence of vegetation, PFO, or any concerning features for cardioembolic etiologies. Contrast allergy, will discuss US doppler of carotids with attending  Recommendations  -start aspirin 81 mg daily and crestor 20 mg for secondary prevention -TSH 6 years ago normal, would recheck outside of acute illness -monitor cardiac telemetry for any indication of arrhythmia  though would expect evidence of bi-hemispheric changes on imaging -PT/OT evaluation -bedside swallow screening by RN -frequent neuro checks ______________________________________________________________________   Thank you for the  opportunity to take part in the care of this patient. If you have any further questions, please contact the neurology consultation attending.  Signed,  Thalia Bloodgood DO  Internal Medicine Resident PGY-3 [place]  Pager: [phone]  I have seen the patient reviewed the above note.  Though the episode that she presented with is not definitely TIA, I would favor screening with stroke workup, she has already had an echo, we can get vascular imaging, telemetry, lipid panel and A1c to assess for modifiable risk factors.  Stroke team to follow.  Ritta Slot, MD [facility] [phone]  If 7pm- 7am, please page neurology on call as listed in [facility]ION.

## 2022-12-04 ENCOUNTER — Encounter (HOSPITAL_COMMUNITY): Payer: Self-pay | Admitting: Cardiovascular Disease

## 2022-12-04 ENCOUNTER — Inpatient Hospital Stay (HOSPITAL_COMMUNITY): Payer: Medicare HMO | Admitting: Certified Registered Nurse Anesthetist

## 2022-12-04 ENCOUNTER — Encounter (HOSPITAL_COMMUNITY)
Admission: AD | Disposition: A | Discharge status: Home / Self Care | Payer: Self-pay | Source: Ambulatory Visit | Attending: Cardiovascular Disease

## 2022-12-04 ENCOUNTER — Inpatient Hospital Stay (HOSPITAL_COMMUNITY): Payer: Medicare HMO

## 2022-12-04 ENCOUNTER — Encounter (HOSPITAL_COMMUNITY): Payer: Medicare HMO

## 2022-12-04 DIAGNOSIS — K2100 Gastro-esophageal reflux disease with esophagitis, without bleeding: Secondary | ICD-10-CM

## 2022-12-04 DIAGNOSIS — Z9049 Acquired absence of other specified parts of digestive tract: Secondary | ICD-10-CM | POA: Diagnosis not present

## 2022-12-04 DIAGNOSIS — Z90411 Acquired partial absence of pancreas: Secondary | ICD-10-CM

## 2022-12-04 DIAGNOSIS — I10 Essential (primary) hypertension: Secondary | ICD-10-CM

## 2022-12-04 DIAGNOSIS — R531 Weakness: Secondary | ICD-10-CM | POA: Diagnosis not present

## 2022-12-04 HISTORY — PX: ESOPHAGOGASTRODUODENOSCOPY (EGD) WITH PROPOFOL: SHX5813

## 2022-12-04 LAB — LIPID PANEL
Cholesterol: 182 mg/dL (ref 0–200)
HDL: 78 mg/dL (ref >40)
LDL Cholesterol: 94 mg/dL (ref 0–99)
Total CHOL/HDL Ratio: 2.3 RATIO
Triglycerides: 48 mg/dL (ref <150)
VLDL: 10 mg/dL (ref 0–40)

## 2022-12-04 SURGERY — ESOPHAGOGASTRODUODENOSCOPY (EGD) WITH PROPOFOL
Anesthesia: Monitor Anesthesia Care

## 2022-12-04 MED ORDER — HYDRALAZINE HCL 10 MG PO TABS
10.0000 mg | ORAL_TABLET | Freq: Three times a day (TID) | ORAL | Status: DC
  Administered 2022-12-04 – 2022-12-05 (×3): 10 mg via ORAL
  Filled 2022-12-04 (×4): qty 1

## 2022-12-04 MED ORDER — IOHEXOL 350 MG/ML SOLN
75.0000 mL | Freq: Once | INTRAVENOUS | Status: AC | PRN
  Administered 2022-12-04: 75 mL via INTRAVENOUS

## 2022-12-04 MED ORDER — LIDOCAINE 2% (20 MG/ML) 5 ML SYRINGE
INTRAMUSCULAR | Status: DC | PRN
  Administered 2022-12-04: 20 mg via INTRAVENOUS
  Administered 2022-12-04: 40 mg via INTRAVENOUS

## 2022-12-04 MED ORDER — PROPOFOL 10 MG/ML IV BOLUS
INTRAVENOUS | Status: DC | PRN
  Administered 2022-12-04: 20 mg via INTRAVENOUS
  Administered 2022-12-04 (×2): 10 mg via INTRAVENOUS

## 2022-12-04 MED ORDER — METOPROLOL TARTRATE 12.5 MG HALF TABLET
12.5000 mg | ORAL_TABLET | Freq: Two times a day (BID) | ORAL | Status: DC
  Administered 2022-12-04 – 2022-12-05 (×3): 12.5 mg via ORAL
  Filled 2022-12-04 (×3): qty 1

## 2022-12-04 MED ORDER — GADOBUTROL 1 MMOL/ML IV SOLN
6.0000 mL | Freq: Once | INTRAVENOUS | Status: AC | PRN
  Administered 2022-12-04: 6 mL via INTRAVENOUS

## 2022-12-04 MED ORDER — PROPOFOL 500 MG/50ML IV EMUL
INTRAVENOUS | Status: DC | PRN
  Administered 2022-12-04: 125 ug/kg/min via INTRAVENOUS

## 2022-12-04 MED ORDER — LACTATED RINGERS IV SOLN: INTRAVENOUS | Status: DC | PRN

## 2022-12-04 MED ORDER — SODIUM CHLORIDE 0.9 % IV SOLN: INTRAVENOUS | Status: DC

## 2022-12-04 SURGICAL SUPPLY — 15 items

## 2022-12-04 NOTE — Anesthesia Postprocedure Evaluation (Signed)
Anesthesia Post Note  Patient: Rebekah Steele  Procedure(s) Performed: ESOPHAGOGASTRODUODENOSCOPY (EGD) WITH PROPOFOL     Patient location during evaluation: Endoscopy Anesthesia Type: MAC Level of consciousness: awake and alert Pain management: pain level controlled Vital Signs Assessment: post-procedure vital signs reviewed and stable Respiratory status: spontaneous breathing, nonlabored ventilation and respiratory function stable Cardiovascular status: stable and blood pressure returned to baseline Postop Assessment: no apparent nausea or vomiting Anesthetic complications: no  No notable events documented.  Last Vitals:  Vitals:   12/04/22 1130 12/04/22 1145  BP: 131/74 (!) 141/80  Pulse: 76 67  Resp: 18 20  Temp: 36.7 C 36.7 C  SpO2: 98% 97%    Last Pain:  Vitals:   12/04/22 1130  TempSrc:   PainSc: 0-No pain                 Veatrice Eckstein,W. EDMOND

## 2022-12-04 NOTE — Progress Notes (Signed)
PT Cancellation Note  Patient Details Name: ERNESTYNE KRENZKE MRN: [id number] DOB: May 01, 1951   Cancelled Treatment:    Reason Eval/Treat Not Completed: Patient at procedure or test/unavailable (Pt off the floor. Will try again later if time allows)   Gladys Damme 12/04/2022, 10:42 AM

## 2022-12-04 NOTE — Care Management (Signed)
  Transition of Care [facility]) Screening Note   Patient Details  Name: Rebekah Steele Date of Birth: January 30, 1951   Transition of Care [facility]) CM/SW Contact:    Gala Lewandowsky, RN Phone Number: 12/04/2022, 4:25 PM    Transition of Care Department [facility]) has reviewed the patient and no TOC needs have been identified at this time. Patient presented for right sided weakness. PT/OT to consult for recommendations. We will continue to monitor patient advancement through interdisciplinary progression rounds. If new patient transition needs arise, please place a TOC consult.

## 2022-12-04 NOTE — Progress Notes (Signed)
Ref: Orpah Cobb, MD   Subjective:  Awake. Ambulating well with cane. HR in 120's.  Endoscopy shows mild esophagitis.  Objective:  Vital Signs in the last 24 hours: Temp:  [97.8 F (36.6 C)-98.1 F (36.7 C)] 97.8 F (36.6 C) (05/09 1209) Pulse Rate:  [58-76] 58 (05/09 1209) Cardiac Rhythm: Normal sinus rhythm (05/09 1145) Resp:  [16-24] 16 (05/09 1209) BP: (131-151)/(72-84) 151/76 (05/09 1223) SpO2:  [97 %-100 %] 97 % (05/09 1209) Weight:  [65.8 kg] 65.8 kg (05/09 1040)  Physical Exam: BP Readings from Last 1 Encounters:  12/04/22 (!) 151/76     Wt Readings from Last 1 Encounters:  12/04/22 65.8 kg    Weight change:  Body mass index is 22.71 kg/m. HEENT: [place]/AT, Eyes-Brown, Conjunctiva-Pink, Sclera-Non-icteric Neck: No JVD, No bruit, Trachea midline. Lungs:  Clear, Bilateral. Cardiac:  Regular rhythm, normal S1 and S2, no S3. II/VI systolic murmur. Abdomen:  Soft, non-tender. BS present. Extremities:  No edema present. No cyanosis. No clubbing. CNS: AxOx3, Cranial nerves grossly intact, moves all 4 extremities.  Skin: Warm and dry.   Intake/Output from previous day: No intake/output data recorded.    Lab Results: BMET    Component Value Date/Time   NA 137 12/03/2022 0430   NA 138 12/02/2022 1537   NA 143 05/16/2021 0001   K 3.2 (L) 12/03/2022 0430   K 3.5 12/02/2022 1537   K 3.4 (L) 05/16/2021 0001   CL 108 12/03/2022 0430   CL 107 12/02/2022 1537   CL 110 05/16/2021 0001   CO2 23 12/03/2022 0430   CO2 20 (L) 12/02/2022 1537   CO2 26 05/16/2021 0001   GLUCOSE 134 (H) 12/03/2022 0430   GLUCOSE 103 (H) 12/02/2022 1537   GLUCOSE 92 05/16/2021 0001   BUN 12 12/03/2022 0430   BUN 14 12/02/2022 1537   BUN 16 05/16/2021 0001   CREATININE 0.88 12/03/2022 0430   CREATININE 0.76 12/02/2022 1537   CREATININE 0.93 05/16/2021 0001   CALCIUM 7.9 (L) 12/03/2022 0430   CALCIUM 8.1 (L) 12/02/2022 1537   CALCIUM 8.8 (L) 05/16/2021 0001   GFRNONAA >60 12/03/2022  0430   GFRNONAA >60 12/02/2022 1537   GFRNONAA >60 05/16/2021 0001   GFRAA 52 (L) 09/20/2019 0540   GFRAA 55 (L) 09/19/2019 1839   GFRAA >60 02/26/2017 0412   CBC    Component Value Date/Time   WBC 3.9 (L) 12/02/2022 1537   RBC 4.11 12/02/2022 1537   HGB 11.5 (L) 12/02/2022 1537   HGB 12.0 08/31/2007 1136   HCT 37.1 12/02/2022 1537   HCT 35.8 08/31/2007 1136   PLT 219 12/02/2022 1537   PLT 247 08/31/2007 1136   MCV 90.3 12/02/2022 1537   MCV 83.4 08/31/2007 1136   MCH 28.0 12/02/2022 1537   MCHC 31.0 12/02/2022 1537   RDW 13.2 12/02/2022 1537   RDW 14.9 (H) 08/31/2007 1136   LYMPHSABS 1.1 12/02/2022 1537   LYMPHSABS 1.2 08/31/2007 1136   MONOABS 0.5 12/02/2022 1537   MONOABS 0.5 08/31/2007 1136   EOSABS 0.2 12/02/2022 1537   EOSABS 0.2 08/31/2007 1136   BASOSABS 0.0 12/02/2022 1537   BASOSABS 0.0 08/31/2007 1136   HEPATIC Function Panel Recent Labs    12/02/22 1537  PROT 5.8*  ALBUMIN 3.2*  AST 18  ALT 13  ALKPHOS 24*   HEMOGLOBIN A1C Lab Results  Component Value Date   MPG 134.11 12/02/2022   CARDIAC ENZYMES Lab Results  Component Value Date   TROPONINI <0.03 02/26/2017  TROPONINI <0.03 02/25/2017   TROPONINI <0.03 02/25/2017   BNP No results for input(s): "PROBNP" in the last 8760 hours. TSH No results for input(s): "TSH" in the last 8760 hours. CHOLESTEROL Recent Labs    12/04/22 0500  CHOL 182    Scheduled Meds:  amLODipine  5 mg Oral Daily   aspirin EC  81 mg Oral Daily   Chlorhexidine Gluconate Cloth  6 each Topical Daily   cycloSPORINE  1 drop Both Eyes Daily   DULoxetine  30 mg Oral Daily   hydrALAZINE  10 mg Oral Q8H   lipase/protease/amylase  12,000 Units Oral TID AC   losartan  100 mg Oral Daily   meclizine  12.5 mg Oral TID   methocarbamol  750 mg Oral QHS   metoprolol tartrate  12.5 mg Oral BID   oxybutynin  5 mg Oral QHS   pantoprazole  40 mg Oral Daily   potassium chloride  10 mEq Oral Daily   rosuvastatin  20 mg Oral  Daily   sodium chloride flush  10-40 mL Intracatheter Q12H   sodium chloride flush  3 mL Intravenous Q12H   Continuous Infusions:  sodium chloride     PRN Meds:.sodium chloride, acetaminophen, fluticasone, ondansetron **OR** ondansetron (ZOFRAN) IV, sodium chloride flush, sodium chloride flush  Assessment/Plan: Right sided weakness S/P old stroke Uncontrolled hypertension, improving with IV medication Chronic nausea and vomiting GERD S/P left breast and pancreatic cancer Protein calorie malnutrition. Slow transit constipation Bilateral renal calculi. S/P Whipple procedure  Plan: DC IV hydralazine. Increase activity. Continue daily PPI. Add very small dose metoprolol.   LOS: 2 days   Time spent including chart review, lab review, examination, discussion with patient/Family/Tech : 30 min   Orpah Cobb  MD  12/04/2022, 2:33 PM

## 2022-12-04 NOTE — Anesthesia Preprocedure Evaluation (Addendum)
Anesthesia Evaluation  Patient identified by MRN, date of birth, ID band Patient awake    Reviewed: Allergy & Precautions, H&P , NPO status , Patient's Chart, lab work & pertinent test results, reviewed documented beta blocker date and time   Airway Mallampati: III  TM Distance: >3 FB Neck ROM: Full    Dental no notable dental hx. (+) Teeth Intact, Dental Advisory Given   Pulmonary neg pulmonary ROS   Pulmonary exam normal breath sounds clear to auscultation       Cardiovascular hypertension, Pt. on medications and Pt. on home beta blockers  Rhythm:Regular Rate:Normal     Neuro/Psych negative neurological ROS  negative psych ROS   GI/Hepatic Neg liver ROS,GERD  Medicated,,  Endo/Other  negative endocrine ROS    Renal/GU negative Renal ROS  negative genitourinary   Musculoskeletal   Abdominal   Peds  Hematology negative hematology ROS (+)   Anesthesia Other Findings   Reproductive/Obstetrics negative OB ROS                             Anesthesia Physical Anesthesia Plan  ASA: 2  Anesthesia Plan: MAC   Post-op Pain Management: Minimal or no pain anticipated   Induction: Intravenous  PONV Risk Score and Plan: 2 and Propofol infusion and Treatment may vary due to age or medical condition  Airway Management Planned: Natural Airway and Nasal Cannula  Additional Equipment:   Intra-op Plan:   Post-operative Plan:   Informed Consent: I have reviewed the patients History and Physical, chart, labs and discussed the procedure including the risks, benefits and alternatives for the proposed anesthesia with the patient or authorized representative who has indicated his/her understanding and acceptance.     Dental advisory given  Plan Discussed with: CRNA  Anesthesia Plan Comments:        Anesthesia Quick Evaluation

## 2022-12-04 NOTE — Evaluation (Signed)
Physical Therapy Evaluation Patient Details Name: Rebekah Steele MRN: [id number] DOB: 01-Mar-1951 Today's Date: 12/04/2022  History of Present Illness  Patient is a 72 year old female who presented with nausea and vomitting with severe hypertension with persistent right side weakness and numbness. Patient was admitted with right sided weakness. 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 PMH: L breast and pancreatic cancer, HTN, vertigo,  Clinical Impression  Pt presents with admitting diagnosis above. Pt was able to ambulate in hallway with [facility] and navigate stairs at Mod I level. Pt presents at or near baseline mobility. Pt has no further acute PT needs and will be signing off. Re consult PT if mobility status changes.        Recommendations for follow up therapy are one component of a multi-disciplinary discharge planning process, led by the attending physician.  Recommendations may be updated based on patient status, additional functional criteria and insurance authorization.  Follow Up Recommendations       Assistance Recommended at Discharge PRN  Patient can return home with the following  A little help with walking and/or transfers;A little help with bathing/dressing/bathroom;Assistance with cooking/housework;Direct supervision/assist for medications management;Assist for transportation    Equipment Recommendations None recommended by PT  Recommendations for Other Services       Functional Status Assessment Patient has had a recent decline in their functional status and demonstrates the ability to make significant improvements in function in a reasonable and predictable amount of time.     Precautions / Restrictions Precautions Precautions: Fall Precaution Comments: h/o vertigo Restrictions Weight Bearing Restrictions: No      Mobility  Bed Mobility               General bed mobility comments: Pt up in chair    Transfers Overall transfer level:  Independent Equipment used: None                    Ambulation/Gait Ambulation/Gait assistance: Modified independent (Device/Increase time) Gait Distance (Feet): 200 Feet Assistive device: Straight cane Gait Pattern/deviations: WFL(Within Functional Limits)   Gait velocity interpretation: >2.62 ft/sec, indicative of community ambulatory   General Gait Details: no LOB noted  Stairs Stairs: Yes Stairs assistance: Modified independent (Device/Increase time) Stair Management: One rail Left, With cane, Forwards, Step to pattern Number of Stairs: 15 General stair comments: no LOB noted  Wheelchair Mobility    Modified Rankin (Stroke Patients Only)       Balance Overall balance assessment: Mild deficits observed, not formally tested                                           Pertinent Vitals/Pain Pain Assessment Pain Assessment: No/denies pain    Home Living Family/patient expects to be discharged to:: Private residence Living Arrangements: Spouse/significant other;Children Available Help at Discharge: Family;Available 24 hours/day Type of Home: House Home Access: Stairs to enter Entrance Stairs-Rails: Left Entrance Stairs-Number of Steps: 4 Alternate Level Stairs-Number of Steps: chair lift Home Layout: Two level;Able to live on main level with bedroom/bathroom Home Equipment: Rolling Walker (2 wheels);Rollator (4 wheels);Cane - single point;Cane - quad;Shower seat - built in;Hand held shower head;BSC/3in1      Prior Function Prior Level of Function : Independent/Modified Independent;Driving;History of Falls (last six months)             Mobility Comments: Ind no AD however pt  reports using SPC "when the wind blows" ADLs Comments: Ind     Hand Dominance   Dominant Hand: Right    Extremity/Trunk Assessment   Upper Extremity Assessment Upper Extremity Assessment: Overall WFL for tasks assessed    Lower Extremity Assessment Lower  Extremity Assessment: Overall WFL for tasks assessed    Cervical / Trunk Assessment Cervical / Trunk Assessment: Normal  Communication   Communication: No difficulties  Cognition Arousal/Alertness: Awake/alert Behavior During Therapy: WFL for tasks assessed/performed Overall Cognitive Status: Within Functional Limits for tasks assessed                                          General Comments General comments (skin integrity, edema, etc.): VSS on RA    Exercises     Assessment/Plan    PT Assessment Patient does not need any further PT services  PT Problem List         PT Treatment Interventions      PT Goals (Current goals can be found in the Care Plan section)       Frequency       Co-evaluation               AM-PAC PT "6 Clicks" Mobility  Outcome Measure Help needed turning from your back to your side while in a flat bed without using bedrails?: None Help needed moving from lying on your back to sitting on the side of a flat bed without using bedrails?: None Help needed moving to and from a bed to a chair (including a wheelchair)?: None Help needed standing up from a chair using your arms (e.g., wheelchair or bedside chair)?: None Help needed to walk in hospital room?: None Help needed climbing 3-5 steps with a railing? : None 6 Click Score: 24    End of Session Equipment Utilized During Treatment: Gait belt Activity Tolerance: Patient tolerated treatment well Patient left: in chair;with call bell/phone within reach;with family/visitor present Nurse Communication: Mobility status PT Visit Diagnosis: Other abnormalities of gait and mobility (R26.89)    Time: 1607-3710 PT Time Calculation (min) (ACUTE ONLY): 22 min   Charges:     PT Treatments $Gait Training: 8-22 mins        Shela Nevin, PT, DPT Acute Rehab Services [phone]   Gladys Damme 12/04/2022, 5:16 PM

## 2022-12-04 NOTE — Transfer of Care (Signed)
Immediate Anesthesia Transfer of Care Note  Patient: Rebekah Steele  Procedure(s) Performed: ESOPHAGOGASTRODUODENOSCOPY (EGD) WITH PROPOFOL  Patient Location: PACU  Anesthesia Type:MAC  Level of Consciousness: awake and drowsy  Airway & Oxygen Therapy: Patient Spontanous Breathing  Post-op Assessment: Report given to RN and Post -op Vital signs reviewed and stable  Post vital signs: Reviewed and stable  Last Vitals:  Vitals Value Taken Time  BP 131/74 12/04/22 1130  Temp    Pulse 76 12/04/22 1130  Resp 18 12/04/22 1130  SpO2 98 % 12/04/22 1130  Vitals shown include unvalidated device data.  Last Pain:  Vitals:   12/04/22 1040  TempSrc: Temporal  PainSc: 0-No pain         Complications: No notable events documented.

## 2022-12-04 NOTE — Interval H&P Note (Signed)
History and Physical Interval Note: EGD today to eval N/V CT read pending The nature of the procedure, as well as the risks, benefits, and alternatives were carefully and thoroughly reviewed with the patient. Ample time for discussion and questions allowed. The patient understood, was satisfied, and agreed to proceed.   12/04/2022 10:57 AM  Rebekah Steele  has presented today for surgery, with the diagnosis of Ab pain, nausea, s/p whipple, gastric bypass.  The various methods of treatment have been discussed with the patient and family. After consideration of risks, benefits and other options for treatment, the patient has consented to  Procedure(s): ESOPHAGOGASTRODUODENOSCOPY (EGD) WITH PROPOFOL (N/A) as a surgical intervention.  The patient's history has been reviewed, patient examined, no change in status, stable for surgery.  I have reviewed the patient's chart and labs.  Questions were answered to the patient's satisfaction.     Carie Caddy Florabelle Cardin

## 2022-12-04 NOTE — Progress Notes (Addendum)
STROKE TEAM PROGRESS NOTE   INTERVAL HISTORY  Evaluated patient at bedside with family present. States that she began experiencing episodic blurry vision over the past 2 weeks. She also notes that she had R sided weakness intermittently during this time. She thinks it was from her blood pressure being too high. Her symptoms were present on admission but they have since resolved. 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 Also has crocodile tears from gustatory lacrimation which developed after the surgical intervention.  Vitals:   12/04/22 1130 12/04/22 1145 12/04/22 1209 12/04/22 1223  BP: 131/74 (!) 141/80 (!) 143/78 (!) 151/76  Pulse: 76 67 (!) 58   Resp: 18 20 16    Temp: 98 F (36.7 C) 98 F (36.7 C) 97.8 F (36.6 C)   TempSrc:   Oral   SpO2: 98% 97% 97%   Weight:      Height:       CBC:  Recent Labs  Lab 12/02/22 1537  WBC 3.9*  NEUTROABS 2.2  HGB 11.5*  HCT 37.1  MCV 90.3  PLT 219   Basic Metabolic Panel:  Recent Labs  Lab 12/02/22 1537 12/03/22 0430  NA 138 137  K 3.5 3.2*  CL 107 108  CO2 20* 23  GLUCOSE 103* 134*  BUN 14 12  CREATININE 0.76 0.88  CALCIUM 8.1* 7.9*   Lipid Panel:  Recent Labs  Lab 12/04/22 0500  CHOL 182  TRIG 48  HDL 78  CHOLHDL 2.3  VLDL 10  LDLCALC 94   HgbA1c:  Recent Labs  Lab 12/02/22 1537  HGBA1C 6.3*     IMAGING past 24 hours MR ANGIO NECK W WO CONTRAST  Result Date: 12/04/2022 CLINICAL DATA:  Stroke.  Determine embolic source. EXAM: MRA NECK WITHOUT AND WITH CONTRAST TECHNIQUE: Multiplanar and multiecho pulse sequences of the neck were obtained without and with intravenous contrast. Angiographic images of the neck were obtained using MRA technique without and with intravenous contrast. CONTRAST:  6mL GADAVIST GADOBUTROL 1 MMOL/ML IV SOLN COMPARISON:  None Available. FINDINGS: Three-vessel aortic arch. No evidence of dissection.  Bilateral common carotid arteries are normal in caliber without evidence of stenosis. Bilateral internal carotid arteries are normal in caliber without evidence of stenosis. No aneurysm. Bilateral vertebral arteries are patent with left dominance. Multinodular thyroid gland with subcentimeter thyroid nodules. No follow up is recommended. IMPRESSION: No evidence of hemodynamically significant stenosis in the neck. Electronically Signed   By: Lorenza Cambridge M.D.   On: 12/04/2022 12:13   CT ABDOMEN PELVIS W CONTRAST  Result Date: 12/04/2022 CLINICAL DATA:  history of pancreas cancer. Status post Whipple. Nausea and vomiting for 3 months. EXAM: CT ABDOMEN AND PELVIS WITH CONTRAST TECHNIQUE: Multidetector CT imaging of the abdomen and pelvis was performed using the standard protocol following bolus administration of intravenous contrast. RADIATION DOSE REDUCTION: This exam was performed according to the departmental dose-optimization program which includes automated exposure control, adjustment of the mA and/or kV according to patient size and/or use of iterative reconstruction technique. CONTRAST:  75mL OMNIPAQUE IOHEXOL 350 MG/ML SOLN COMPARISON:  None FINDINGS: Lower chest: No acute abnormality. Hepatobiliary: No suspicious liver lesions identified. Status post cholecystectomy and Whipple procedure. Pancreas: Postoperative changes from Whipple procedure identified. The remaining pancreas appears normal. No main duct dilatation or suspicious mass. Spleen: Normal in size without focal abnormality. Adrenals/Urinary Tract: Normal adrenal glands. Bilateral kidney cysts. No suspicious kidney mass.  No signs of hydronephrosis. Small bilateral nonobstructing renal calculi are noted. Stones measure up to 2 mm. Stomach/Bowel: Postoperative changes from Whipple procedure. The stomach is nondistended. No pathologic dilatation of the large or small bowel loops. There is a moderate stool burden identified throughout the colon  which may reflect underlying constipation. No bowel wall thickening or inflammation. Vascular/Lymphatic: Aortic atherosclerotic calcifications. No aneurysm. No signs of abdominal or retroperitoneal adenopathy. No pelvic or inguinal adenopathy. Reproductive: No mass identified. Other: No free fluid or fluid collections. No signs of pneumoperitoneum. Musculoskeletal: No acute or suspicious osseous findings. IMPRESSION: 1. No acute findings within the abdomen or pelvis. 2. Status post Whipple procedure. No findings to suggest recurrent tumor or metastatic disease. 3. Bilateral nonobstructing renal calculi. 4. Moderate stool burden identified throughout the colon which may reflect underlying constipation. 5.  Aortic Atherosclerosis (ICD10-I70.0). Electronically Signed   By: Signa Kell M.D.   On: 12/04/2022 11:50   MR ANGIO HEAD WO CONTRAST  Result Date: 12/04/2022 CLINICAL DATA:  Stroke.  Assessment for embolic source. EXAM: MRA HEAD WITHOUT CONTRAST TECHNIQUE: Angiographic images of the Circle of Willis were acquired using MRA technique without intravenous contrast. COMPARISON:  None Available. FINDINGS: POSTERIOR CIRCULATION: --Vertebral arteries: Normal --Inferior cerebellar arteries: Normal. --Basilar artery: Normal. --Superior cerebellar arteries: Normal. --Posterior cerebral arteries: Normal. ANTERIOR CIRCULATION: --Intracranial internal carotid arteries: Normal. --Anterior cerebral arteries (ACA): Normal. --Middle cerebral arteries (MCA): Normal. Other: None. IMPRESSION: Normal intracranial MRA. Electronically Signed   By: Deatra Robinson M.D.   On: 12/04/2022 01:36    PHYSICAL EXAM General: pleasant elderly female, sitting in chair, NAD. CV: normal rate and regular rhythm, no m/r/g. Pulm: Normal WOB on RA. Psych: calm and cooperative with exam.  Neuro: Mental Status: AAOx3. Follows commands appropriately. No slurred speech noted. Speech is intelligible.  Cranial Nerves: II: PERRL III, IV, VI:  EOMI, no gaze preference. V: slightly diminished R sided facial sensation, chronic. VII: R sided facial weakness, chronic.  Right facial tics. VIII: hearing is intact to voice. IX, XI: uvula midline and symmetric palatal elevation. XI: symmetric shoulder shrug XII: tongue protrudes midline.  Motor: Strength 4+/5 in RUE and RLE. Strength 5/5 in LUE and LLE. Normal bulk and tone.  Sensation: slightly diminished on R side but minimal.  Coordination: FNF intact bilaterally and heel-to-shin intact bilaterally as well.  ASSESSMENT/PLAN Rebekah Steele is a 72 y.o. 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   Hypertensive Emergency versus less likely left brain subcortical TIA from small vessel disease Initially presented to the ED with hypertensive emergency. BP 180s/90s along with visual changes, mild right sided weakness/numbness, and N/V. Her symptoms improved with improvement in her BP. 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 2D Echo with LVEF 55-60%, no RWMA, G1DD, moderate LA dilation, degenerative MV with moderate MVR, no shunt. LDL 94, started on crestor 20mg  daily HgbA1c 6.3 Was previously on ASA therapy but stopped 3 months ago. Given possibility of TIA and prior CVA, recommend lifelong ASA 81mg  therapy Therapy recommendations:  pending Disposition:  pending Currently on norvasc 5mg  daily, lopressor 12.5mg  BID,  losartan 100mg  daily Long-term BP goal normotensive Cardiology managing as primary  Hyperlipidemia Home meds:  none LDL 94, goal < 70 given prior CVA and possible TIA Continue crestor 20mg  daily Continue statin at discharge  Prediabetes Home meds:  none HgbA1c 6.3, goal < 7.0 CBGs, goal 140-180 Add SSI if CBGs >180  Other Stroke Risk Factors Advanced Age >/= 59  Hx stroke/TIA  Other Active Problems Mild esophagitis Chronic N/V  Hospital day # 2  Given her history of exam findings, it does appear that her neurological deficits were likely due to presenting hypertensive emergency. Imaging does not show any new infarct that would explain symptoms. It is possible that she may have a TIA, but management would be the same given prior cerebellar infarct (clinically silent) noted on imaging. Will defer management of HTN to cardiology but she will need long-term improved BP control which I discussed with her. Given old stroke, recommend lifelong ASA 81mg  monotherapy and continuation of statin at discharge. Her symptoms have largely resolved on exam and thus no further neurology management required at this time. Neurology will sign off, but please reach back out to [place] if there are any questions or concerns.  Merrilyn Puma, MD [facility], PGY-3 12/04/2022, 4:30 PM  STROKE MD NOTE :  I have personally obtained history,examined this patient, reviewed notes, independently viewed imaging studies, participated in medical decision making and plan of care.ROS completed by me personally and pertinent positives fully documented  I have made any additions or clarifications directly to the above note. Agree with note above.  Patient presented with hypertensive emergency with transient right-sided weakness and some vision difficulties.  She states symptoms resolved quickly after blood pressure came down.  MRI is negative for acute stroke.  Neurological exam is fairly benign except chronic right facial  weakness and facial tics from previous trigeminal neuralgia surgery.  Recommend aspirin for stroke prevention and maintain aggressive risk factor modification given MRI findings of a previous silent right cerebellar stroke.  Long discussion with patient and daughter at the bedside and answered questions.  Greater than 50% time during this 50-minute visit was spent on counseling and coordination of care about her hypertensive urgency and TIA-like symptoms and answering questions  Delia Heady, MD Medical Director [facility] Pager: [phone] 12/04/2022 4:41 PM   To contact Stroke Continuity provider, please refer to [email or web address]. After hours, contact General Neurology

## 2022-12-04 NOTE — Op Note (Signed)
[facility] Patient Name: Rebekah Steele Procedure Date : 12/04/2022 MRN: [id number] Attending MD: Beverley Fiedler , MD, [id number] Date of Birth: Dec 24, 1950 CSN: [id number] Age: 72 Admit Type: Inpatient Procedure:                Upper GI endoscopy Indications:              Nausea with vomiting, hx of pancreatic cancer (RYGB                            and then Whipple procedure) Providers:                Carie Caddy. Rhea Belton, MD, Glory Rosebush, RN, Harrington Challenger,                            Technician Referring MD:             Orpah Cobb Medicines:                Monitored Anesthesia Care Complications:            No immediate complications. Estimated Blood Loss:     Estimated blood loss: none. Procedure:                Pre-Anesthesia Assessment:                           - Prior to the procedure, a History and Physical                            was performed, and patient medications and                            allergies were reviewed. The patient's tolerance of                            previous anesthesia was also reviewed. The risks                            and benefits of the procedure and the sedation                            options and risks were discussed with the patient.                            All questions were answered, and informed consent                            was obtained. Prior Anticoagulants: The patient has                            taken no anticoagulant or antiplatelet agents. ASA                            Grade Assessment: III - A patient with severe  systemic disease. After reviewing the risks and                            benefits, the patient was deemed in satisfactory                            condition to undergo the procedure.                           After obtaining informed consent, the endoscope was                            passed under direct vision. Throughout the                            procedure,  the patient's blood pressure, pulse, and                            oxygen saturations were monitored continuously. The                            [id number]-[id number] ([id number] Olympus endoscope was introduced                            through the mouth, and advanced to the efferent                            jejunal loop. The upper GI endoscopy was                            accomplished without difficulty. The patient                            tolerated the procedure well. Scope In: Scope Out: Findings:      Mild esophagitis with no bleeding was found in the lower third of the       esophagus.      Evidence of a Roux-en-Y gastric bypass anatomy with small gastric pouch       of about 3 cm was found in the stomach. This was characterized by       healthy appearing mucosa. The gastric small bowel anastomosis is normal.      The examined jejunum was normal. Impression:               - Mild reflux esophagitis with no bleeding. Cannot                            exclude this contributes to nausea (or is a result                            of vomiting).                           - Roux-en-Y gastric bypass anatomy characterized by  healthy appearing mucosa.                           - Normal examined jejunum.                           - No specimens collected. Moderate Sedation:      N/A Recommendation:           - Return patient to hospital ward for ongoing care.                           - Resume previous diet.                           - Continue present medications. Would use once                            daily PPI (open capsules and sprinkle on apple                            sauce for administration to enhance absorption                            given gastric bypass anatomy)                           - Follow-up abd/pelvis CT results.                           - Also of not nausea has been better while here and                            while BP controlled.                            - GI will sign off, call if questions. 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 All rights reserved. The codes documented in this report are preliminary and upon coder review may  be revised to meet current compliance requirements. Beverley Fiedler, MD 12/04/2022 11:44:14 AM This report has been signed electronically. Number of Addenda: 0

## 2022-12-04 NOTE — Anesthesia Procedure Notes (Signed)
Procedure Name: MAC Date/Time: 12/04/2022 11:14 AM  Performed by: Lelon Perla, CRNAPre-anesthesia Checklist: Patient identified, Suction available, Emergency Drugs available, Patient being monitored and Timeout performed Patient Re-evaluated:Patient Re-evaluated prior to induction Oxygen Delivery Method: Nasal cannula Preoxygenation: Pre-oxygenation with 100% oxygen Induction Type: IV induction Placement Confirmation: positive ETCO2

## 2022-12-04 NOTE — Plan of Care (Signed)

## 2022-12-05 LAB — BASIC METABOLIC PANEL
Anion gap: 7 (ref 5–15)
BUN: 27 mg/dL — ABNORMAL HIGH (ref 8–23)
CO2: 25 mmol/L (ref 22–32)
Calcium: 8 mg/dL — ABNORMAL LOW (ref 8.9–10.3)
Chloride: 108 mmol/L (ref 98–111)
Creatinine, Ser: 1.2 mg/dL — ABNORMAL HIGH (ref 0.44–1.00)
GFR, Estimated: 48 mL/min — ABNORMAL LOW (ref >60)
Glucose, Bld: 136 mg/dL — ABNORMAL HIGH (ref 70–99)
Potassium: 4 mmol/L (ref 3.5–5.1)
Sodium: 140 mmol/L (ref 135–145)

## 2022-12-05 MED ORDER — POTASSIUM CHLORIDE CRYS ER 10 MEQ PO TBCR: 10.0000 meq | EXTENDED_RELEASE_TABLET | Freq: Every day | ORAL | 3 refills | Status: AC

## 2022-12-05 MED ORDER — SODIUM CHLORIDE 0.9 % IV SOLN: INTRAVENOUS | Status: AC

## 2022-12-05 MED ORDER — HYDRALAZINE HCL 10 MG PO TABS: 10.0000 mg | ORAL_TABLET | Freq: Three times a day (TID) | ORAL | 3 refills | Status: AC

## 2022-12-05 MED ORDER — ASPIRIN 81 MG PO TBEC: 81.0000 mg | DELAYED_RELEASE_TABLET | Freq: Every day | ORAL | 12 refills | Status: AC

## 2022-12-05 MED ORDER — ROSUVASTATIN CALCIUM 20 MG PO TABS: 20.0000 mg | ORAL_TABLET | Freq: Every day | ORAL | 3 refills | Status: AC

## 2022-12-05 MED ORDER — HEPARIN SOD (PORK) LOCK FLUSH 100 UNIT/ML IV SOLN
500.0000 [IU] | INTRAVENOUS | Status: AC | PRN
  Administered 2022-12-05: 500 [IU]

## 2022-12-05 NOTE — Care Management Important Message (Signed)
Important Message  Patient Details  Name: Rebekah Steele MRN: [id number] Date of Birth: 1950-12-21   Medicare Important Message Given:  Yes     Renie Ora 12/05/2022, 11:14 AM

## 2022-12-05 NOTE — Discharge Summary (Signed)
Physician Discharge Summary  Patient ID: Rebekah Steele MRN: [id number] DOB/AGE: [birth year] 72 y.o.  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   Trigeminal neuralgia with surgery   Uncontrolled hypertension, improving   Medication non-compliance due to nausea and vomiting   GERD with mild esophagitis   Slow transit constipation   S/P left breast cancer with lumpectomy   S/P pancreatic cancer with Whipple procedure   Bilateral non-obstructing renal calculi   Mild protein calorie malnutrition  Discharged Condition: fair  Hospital Course: 72 years old black female with PMH of breast and pancreatic cancer, s/p surgeries for cancer, GERD, protein calorie malnutrition, chronic nausea and vomiting had elevated blood pressure from not able to take BP medications. She also has right sided weakness ongoing for over 1 month.  She had non-focal neurological evaluation with old cerebellar stroke found by MRI/MRA of head and neck. GI consult and EGD revealed GERD with mild esophagitis.  Echocardiogram showed normal LV systolic function with mild to moderate MR. She responded to IV hydralazine changed to oral doses, PPI, Aspirin and Rosuvastatin along with her usual home medications. Her neurological symptoms improved with normalization of blood pressure. Patient understood compliance with medications and increasing activity as tolerated. She was discharged home in stable condition. She will see me in 1 week and [facility] as arranged. She may see local GI and neurology as needed.  Consults: GI and neurology  Significant Diagnostic Studies: labs: Near normal CBC with mild  chronic low WBC count, near normal electrolytes, mild hyperglycemia, Hgb A1C of 6.3 %.  Echocardiogram: Normal LV systolic function with Moderate MR.  MRI/MRA Head and neck was without acute finding and showed small vessel disease and old cerebellar stroke.  EGD showed GERD with mild esophagitis.  CT abdomen/Pelvis was without acute finding, non-obstructive renal calculi and aortic atherosclerosis.  Treatments: cardiac meds: Aspirin, losartan, hydralazine, metoprolol, amlodipine, and Rosuvastatin.  Discharge Exam: Blood pressure (!) 140/71, pulse 82, temperature 98.1 F (36.7 C), temperature source Oral, resp. rate 20, height 5\' 7"  (1.702 m), weight 63.3 kg, SpO2 97 %. General appearance: alert, cooperative and appears stated age. Head: Normocephalic, atraumatic. Eyes: Brown eyes, pink conjunctiva, corneas clear. Wears glasses.  Neck: No adenopathy, no carotid bruit, no JVD, supple, symmetrical, trachea midline and thyroid not enlarged. Resp: Clear to auscultation bilaterally. Cardio: Regular rate and rhythm, S1, S2 normal, II/VI systolic murmur, no click, rub or gallop. GI: Soft, non-tender; bowel sounds normal; no organomegaly. Extremities: No edema, cyanosis or clubbing. Skin: Warm and dry.  Neurologic: Alert and oriented X 3, Chronic right sided facial weakness, normal strength and tone. Normal coordination and slow gait.  Disposition: Discharge disposition: 01-Home or Self Care        Allergies as of 12/05/2022       Reactions   Iodinated Contrast Media Anaphylaxis, Hives   Hives and oral swelling   Hives and oral swelling  Hives and oral swelling   Latex Rash, Itching   From powder in glove states patient blisters   Wound Dressing Adhesive Itching   Paper  Tape - skin blisters        Medication List     STOP taking these medications    azithromycin 500 MG  tablet Commonly known as: ZITHROMAX       TAKE these medications    acetaminophen 500 MG  tablet Commonly known as: TYLENOL Take 500 mg by mouth every 6 (six) hours as needed. pain   amLODipine 5 MG tablet Commonly known as: NORVASC Take 1 tablet (5 mg total) by mouth daily.   aspirin EC 81 MG tablet Take 1 tablet (81 mg total) by mouth daily. Swallow whole. Start taking on: Dec 06, 2022   Cholecalciferol 50 MCG (2000 UT) Caps Take 1 capsule by mouth daily.   cycloSPORINE 0.05 % ophthalmic emulsion Commonly known as: RESTASIS Place 1 drop into both eyes daily.   diphenhydrAMINE 25 mg capsule Commonly known as: BENADRYL Take 25 mg by mouth 2 (two) times daily as needed.   docusate calcium 240 MG capsule Commonly known as: SURFAK Take 240 mg by mouth daily.   DULoxetine 30 MG capsule Commonly known as: CYMBALTA Take 30 mg by mouth daily.   ferrous sulfate 325 (65 FE) MG tablet Take 325 mg by mouth daily with breakfast.   fluticasone 50 MCG/ACT nasal spray Commonly known as: FLONASE Place 1 spray into the nose daily as needed. allergies   hydrALAZINE 10 MG tablet Commonly known as: APRESOLINE Take 1 tablet (10 mg total) by mouth 3 (three) times daily.   letrozole 2.5 MG tablet Commonly known as: FEMARA Take 2.5 mg by mouth daily.   lidocaine-prilocaine cream Commonly known as: EMLA Apply 1 application topically daily as needed (For rash).   lipase/protease/amylase 12000-38000 units Cpep capsule Commonly known as: CREON Take 12,000 Units by mouth 3 (three) times daily before meals.   losartan 100 MG tablet Commonly known as: COZAAR Take 100 mg by mouth daily.   meclizine 12.5 MG tablet Commonly known as: ANTIVERT Take 1 tablet (12.5 mg total) by mouth 3 (three) times daily.   methocarbamol 750 MG tablet Commonly known as: ROBAXIN Take 750 mg by mouth in the morning and at bedtime.   metoprolol succinate 25 MG 24 hr tablet Commonly known as: TOPROL-XL Take 25 mg by mouth daily.   omeprazole 40 MG capsule Commonly known as: PRILOSEC Take  40 mg by mouth daily.   ondansetron 4 MG tablet Commonly known as: ZOFRAN Take 1 tablet (4 mg total) by mouth every 8 (eight) hours as needed for nausea.   oxybutynin 5 MG tablet Commonly known as: DITROPAN Take 5 mg by mouth daily.   potassium chloride 10 MEQ tablet Commonly known as: KLOR-CON M Take 1 tablet (10 mEq total) by mouth daily. Start taking on: Dec 06, 2022   promethazine 25 MG tablet Commonly known as: PHENERGAN Take 25 mg by mouth every 6 (six) hours as needed for nausea or vomiting.   rosuvastatin 20 MG tablet Commonly known as: CRESTOR Take 1 tablet (20 mg total) by mouth daily. Start taking on: Dec 06, 2022   triamcinolone cream 0.1 % Commonly known as: KENALOG Apply 1 Application topically 2 (two) times daily.         Time spent: Review of old chart, current chart, lab, x-ray, cardiac tests and discussion with patient over 60 minutes.  Signed: Ricki Rodriguez 12/05/2022, 12:30 PM

## 2022-12-07 ENCOUNTER — Encounter (HOSPITAL_COMMUNITY): Payer: Self-pay | Admitting: Internal Medicine
